# Patient Record
Sex: Female | Born: 1965 | Race: Black or African American | Hispanic: No | Marital: Married | Smoking: Never smoker
Health system: Southern US, Community
[De-identification: ages and names within clinical notes are randomized; demographics above are authoritative.]

## PROBLEM LIST (undated history)

## (undated) DIAGNOSIS — E0590 Thyrotoxicosis, unspecified without thyrotoxic crisis or storm: Secondary | ICD-10-CM

## (undated) DIAGNOSIS — E049 Nontoxic goiter, unspecified: Secondary | ICD-10-CM

## (undated) DIAGNOSIS — Z8619 Personal history of other infectious and parasitic diseases: Secondary | ICD-10-CM

## (undated) DIAGNOSIS — I10 Essential (primary) hypertension: Secondary | ICD-10-CM

## (undated) DIAGNOSIS — T7840XA Allergy, unspecified, initial encounter: Secondary | ICD-10-CM

## (undated) DIAGNOSIS — I519 Heart disease, unspecified: Secondary | ICD-10-CM

## (undated) DIAGNOSIS — I5020 Unspecified systolic (congestive) heart failure: Secondary | ICD-10-CM

## (undated) HISTORY — DX: Essential (primary) hypertension: I10

## (undated) HISTORY — DX: Nontoxic goiter, unspecified: E04.9

## (undated) HISTORY — DX: Heart disease, unspecified: I51.9

## (undated) HISTORY — DX: Personal history of other infectious and parasitic diseases: Z86.19

## (undated) HISTORY — DX: Unspecified systolic (congestive) heart failure: I50.20

## (undated) HISTORY — DX: Morbid (severe) obesity due to excess calories: E66.01

## (undated) HISTORY — DX: Thyrotoxicosis, unspecified without thyrotoxic crisis or storm: E05.90

## (undated) HISTORY — DX: Allergy, unspecified, initial encounter: T78.40XA

---

## 1992-03-04 HISTORY — PX: ABDOMINAL HYSTERECTOMY: SHX81

## 2012-07-25 ENCOUNTER — Ambulatory Visit: Payer: Self-pay | Admitting: Family Medicine

## 2012-07-27 LAB — BETA STREP CULTURE(ARMC)

## 2013-05-19 ENCOUNTER — Ambulatory Visit: Payer: Self-pay

## 2013-07-09 ENCOUNTER — Encounter (INDEPENDENT_AMBULATORY_CARE_PROVIDER_SITE_OTHER): Payer: Self-pay

## 2013-07-09 ENCOUNTER — Ambulatory Visit (INDEPENDENT_AMBULATORY_CARE_PROVIDER_SITE_OTHER): Payer: BC Managed Care – PPO | Admitting: Adult Health

## 2013-07-09 ENCOUNTER — Telehealth: Payer: Self-pay | Admitting: Adult Health

## 2013-07-09 ENCOUNTER — Encounter: Payer: Self-pay | Admitting: Adult Health

## 2013-07-09 VITALS — BP 147/89 | HR 100 | Temp 98.0°F | Resp 16 | Ht 71.0 in | Wt 281.5 lb

## 2013-07-09 DIAGNOSIS — I10 Essential (primary) hypertension: Secondary | ICD-10-CM

## 2013-07-09 DIAGNOSIS — Z1239 Encounter for other screening for malignant neoplasm of breast: Secondary | ICD-10-CM

## 2013-07-09 DIAGNOSIS — E0590 Thyrotoxicosis, unspecified without thyrotoxic crisis or storm: Secondary | ICD-10-CM

## 2013-07-09 MED ORDER — METOPROLOL TARTRATE 50 MG PO TABS: 50.0000 mg | ORAL_TABLET | Freq: Every day | ORAL | Status: DC

## 2013-07-09 NOTE — Patient Instructions (Signed)
  Start your metoprolol. I have sent in a prescription.  Also, please start your thyroid medication. I am referring your to Dr. Elvera LennoxGherghe. We will contact you with an appointment.  I am sending you for a mammogram. Please see Robin.  I will request your records from Dr. Tedd SiasSolum and the labs from [facility].  You will need to schedule your physical exam. You can schedule this when your work schedule calms down some.  Remember to start walking. Introduce fruit into your diet. Drink plenty of water.

## 2013-07-09 NOTE — Progress Notes (Signed)
Pre visit review using our clinic review tool, if applicable. No additional management support is needed unless otherwise documented below in the visit note. 

## 2013-07-09 NOTE — Progress Notes (Signed)
Subjective:    Patient ID: Jeanne [name], female    DOB: 1965-05-28, 48 y.o.   MRN: [id number]  HPI Pt is a pleasant 48 y/o female who presents to clinic to establish care. She works at [employer] and has been going to [facility]. Recently diagnosed with hyperthyroidism. She has [facility] to [facility]. She was prescribed methimazole 10 mg however she has not started taking this.  Hx of HTN on metoprolol. She has run out of her medication and will need a refill. Currently not exercising. She is trying to improve her diet.   Needs a mammogram. She had a hysterectomy in her early 3920s. No abnormal PAP. She reports no longer needing PAPs. She has [facility] and also at [facility]. Will request those records.   Past Medical History  Diagnosis Date  . History of chicken pox   . Allergy   . Hypertension   . Hyperthyroidism      Past Surgical History  Procedure Laterality Date  . Abdominal hysterectomy  1994    [place]     Family History  Problem Relation Age of Onset  . Hypertension Maternal Grandmother   . Diabetes Maternal Grandmother      History   Social History  . Marital Status: Single    Spouse Name: N/A    Number of Children: N/A  . Years of Education: N/A   Occupational History  . Not on file.   Social History Main Topics  . Smoking status: Never Smoker   . Smokeless tobacco: Never Used  . Alcohol Use: Yes     Comment: socially  . Drug Use: No  . Sexual Activity: Not on file   Other Topics Concern  . Not on file   Social History Narrative  . No narrative on file    Review of Systems  Constitutional: Negative.   HENT: Negative.   Eyes: Negative.   Respiratory: Negative.   Cardiovascular: Negative.   Gastrointestinal: Negative.   Endocrine: Negative.   Genitourinary: Negative.   Musculoskeletal: Negative.   Skin: Negative.   Allergic/Immunologic: Negative.   Neurological: Negative.     Hematological: Negative.   Psychiatric/Behavioral: Negative.    BP 147/89  Pulse 100  Temp(Src) 98 F (36.7 C) (Oral)  Resp 16  Ht 5\' 11"  (1.803 m)  Wt 281 lb 8 oz (127.688 kg)  BMI 39.28 kg/m2  SpO2 100%     Objective:   Physical Exam  Constitutional: She is oriented to person, place, and time. She appears well-developed and well-nourished. No distress.  HENT:  Head: Normocephalic and atraumatic.  Right Ear: External ear normal.  Left Ear: External ear normal.  Nose: Nose normal.  Mouth/Throat: Oropharynx is clear and moist.  Eyes: Conjunctivae and EOM are normal. Pupils are equal, round, and reactive to light.  Neck: Normal range of motion. Neck supple. No tracheal deviation present. No thyromegaly present.  Cardiovascular: Normal rate, regular rhythm, normal heart sounds and intact distal pulses.  Exam reveals no gallop and no friction rub.   No murmur heard. Pulmonary/Chest: Effort normal and breath sounds normal. No respiratory distress. She has no wheezes. She has no rales.  Abdominal: Soft. Bowel sounds are normal. She exhibits no distension and no mass. There is no tenderness. There is no rebound and no guarding.  Musculoskeletal: Normal range of motion. She exhibits no edema and no tenderness.  Lymphadenopathy:    She has no  cervical adenopathy.  Neurological: She is alert and oriented to person, place, and time. She has normal reflexes. No cranial nerve deficit. Coordination normal.  Skin: Skin is warm and dry.  Psychiatric: She has a normal mood and affect. Her behavior is normal. Judgment and thought content normal.       Assessment & Plan:   1. HTN (hypertension) Refill metoprolol 50 mg. Request recent blood work  2. Hyperthyroidism She will start her tapazole. I am referring her to Dr. Elvera LennoxGherghe. She saw Dr. Tedd SiasSolum but felt that she did not provide her with options for her overactive thyroid. She kept insisting that she had to do the radiation and there was no  other treatment. Pt did not like her bedside manner. Felt she was treated more like a child than an adult. She prefers to [facility] within [facility].  - Ambulatory referral to Endocrinology  3. Screening for breast cancer Sending pt for screening mammogram.  - MM DIGITAL SCREENING BILATERAL; Future

## 2013-07-09 NOTE — Telephone Encounter (Signed)
Mammogram scheduled 07/27/13 @ 10:30  imaging Pt aware of appointment

## 2013-07-10 ENCOUNTER — Telehealth: Payer: Self-pay | Admitting: Adult Health

## 2013-07-10 NOTE — Telephone Encounter (Signed)
Relevant patient education assigned to patient using Emmi. ° °

## 2013-07-16 NOTE — Telephone Encounter (Signed)
Put appointmnet date and time on order

## 2013-08-31 ENCOUNTER — Ambulatory Visit: Payer: BC Managed Care – PPO | Admitting: Internal Medicine

## 2014-05-11 ENCOUNTER — Encounter: Payer: Self-pay | Admitting: Nurse Practitioner

## 2014-05-11 ENCOUNTER — Ambulatory Visit (INDEPENDENT_AMBULATORY_CARE_PROVIDER_SITE_OTHER): Payer: BLUE CROSS/BLUE SHIELD | Admitting: Nurse Practitioner

## 2014-05-11 VITALS — BP 122/84 | HR 87 | Temp 98.1°F | Resp 14 | Ht 71.0 in | Wt 277.4 lb

## 2014-05-11 DIAGNOSIS — J069 Acute upper respiratory infection, unspecified: Secondary | ICD-10-CM

## 2014-05-11 DIAGNOSIS — E0590 Thyrotoxicosis, unspecified without thyrotoxic crisis or storm: Secondary | ICD-10-CM

## 2014-05-11 DIAGNOSIS — M545 Low back pain, unspecified: Secondary | ICD-10-CM

## 2014-05-11 DIAGNOSIS — B9789 Other viral agents as the cause of diseases classified elsewhere: Secondary | ICD-10-CM

## 2014-05-11 NOTE — Progress Notes (Signed)
Subjective:    Patient ID: Jeanne [name], female    DOB: 1965/10/18, 49 y.o.   MRN: [id number]  HPI  Ms. Barnabas ListerWashington is a 49 yo female with a CC of back pain and cold symptoms.   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 Right arm numbness. 10/10 pain at its worst  Ice, Ibuprofen, heat, ASA- somewhat helpful   Review of Systems  Constitutional: Negative for fever, chills, diaphoresis and fatigue.  Respiratory: Negative for chest tightness, shortness of breath and wheezing.   Cardiovascular: Negative for chest pain, palpitations and leg swelling.  Gastrointestinal: Negative for nausea, vomiting, diarrhea and rectal pain.  Musculoskeletal: Positive for myalgias and back pain. Negative for joint swelling, arthralgias, gait problem, neck pain and neck stiffness.       Left side low back pain  Skin: Negative for rash.  Neurological: Positive for light-headedness. Negative for dizziness, weakness, numbness and headaches.  Psychiatric/Behavioral: The patient is not nervous/anxious.    Past Medical History  Diagnosis Date  . History of chicken pox   . Allergy   . Hypertension   . Hyperthyroidism     History   Social History  . Marital Status: Single    Spouse Name: N/A  . Number of Children: N/A  . Years of Education: N/A   Occupational History  . Not on file.   Social History Main Topics  . Smoking status: Never Smoker   . Smokeless tobacco: Never Used  . Alcohol Use: Yes     Comment: socially  . Drug Use: No  . Sexual Activity: Not on file   Other Topics Concern  . Not on file   Social History  Narrative    Past Surgical History  Procedure Laterality Date  . Abdominal hysterectomy  1994    [place]    Family History  Problem Relation Age of Onset  . Hypertension Maternal Grandmother   . Diabetes Maternal Grandmother     No Known Allergies  Current Outpatient Prescriptions on File Prior to Visit  Medication Sig Dispense Refill  . Azelastine-Fluticasone (DYMISTA) 137-50 MCG/ACT SUSP Place 1 spray into the nose daily.    . chlorpheniramine (CHLOR-TRIMETON) 4 MG tablet Take 4 mg by mouth 2 (two) times daily as needed for allergies.    . methimazole (TAPAZOLE) 10 MG tablet Take 10 mg by mouth daily.    . metoprolol (LOPRESSOR) 50 MG tablet Take 1 tablet (50 mg total) by mouth daily. (Patient not taking: Reported on 05/11/2014) 30 tablet 11  . VITAMIN D, CHOLECALCIFEROL, PO Take 4,000 Units by mouth daily.     No current facility-administered medications on file prior to visit.      Objective:   Physical Exam  Constitutional: She is oriented to person, place, and time. She appears well-developed and well-nourished. No distress.  BP 122/84 mmHg  Pulse 87  Temp(Src) 98.1 F (36.7 C) (Oral)  Resp 14  Ht 5\' 11"  (1.803 m)  Wt 277 lb 6.4 oz (125.828 kg)  BMI 38.71 kg/m2  SpO2 98%   HENT:  Head: Normocephalic and atraumatic.  Right Ear: External ear normal.  Left Ear:  External ear normal.  Mouth/Throat: No oropharyngeal exudate.  TM's clear  Eyes: Right eye exhibits no discharge. Left eye exhibits no discharge. No scleral icterus.  Neck: Normal range of motion. Neck supple. No thyromegaly present.  Cardiovascular: Normal rate, regular rhythm, normal heart sounds and intact distal pulses.  Exam reveals no gallop and no friction rub.   No murmur heard. Pulmonary/Chest: Effort normal and breath sounds normal. No respiratory distress. She has no wheezes. She has no rales. She exhibits no tenderness.  Lymphadenopathy:    She has no cervical adenopathy.  Neurological: She is  alert and oriented to person, place, and time. No cranial nerve deficit. She exhibits normal muscle tone. Coordination normal.  Iliopsoas 5/5 Bilateral, Tib anterior 5/5 bilateral, EHL 5/5 bilateral, no ankle clonus, intact heel/toe/sequential walking, sensation intact upper and lower extremities. Straight leg raise negative bilaterally.   Skin: Skin is warm and dry. No rash noted. She is not diaphoretic.  Psychiatric: She has a normal mood and affect. Her behavior is normal. Judgment and thought content normal.      Assessment & Plan:

## 2014-05-11 NOTE — Progress Notes (Signed)
Pre visit review using our clinic review tool, if applicable. No additional management support is needed unless otherwise documented below in the visit note. 

## 2014-05-11 NOTE — Patient Instructions (Addendum)
Ice, ibuprofen, and lower back x-ray.   We will follow up in 2 weeks .  Visit the lab before leaving today.   Cold- Dayquil, nyquil and ibuprofen as needed. Should be better in a week or so.   [place] [facility] Address:  Address: [address], [place], [place] [address]  [phone] X-rays until 4:15 pm.

## 2014-05-12 ENCOUNTER — Other Ambulatory Visit: Payer: Self-pay | Admitting: Nurse Practitioner

## 2014-05-12 ENCOUNTER — Ambulatory Visit (INDEPENDENT_AMBULATORY_CARE_PROVIDER_SITE_OTHER)
Admission: RE | Admit: 2014-05-12 | Discharge: 2014-05-12 | Disposition: A | Discharge status: Home / Self Care | Payer: BLUE CROSS/BLUE SHIELD | Source: Ambulatory Visit | Attending: Nurse Practitioner | Admitting: Nurse Practitioner

## 2014-05-12 DIAGNOSIS — B9789 Other viral agents as the cause of diseases classified elsewhere: Secondary | ICD-10-CM

## 2014-05-12 DIAGNOSIS — M545 Low back pain, unspecified: Secondary | ICD-10-CM

## 2014-05-12 DIAGNOSIS — E0590 Thyrotoxicosis, unspecified without thyrotoxic crisis or storm: Secondary | ICD-10-CM

## 2014-05-12 DIAGNOSIS — J069 Acute upper respiratory infection, unspecified: Secondary | ICD-10-CM | POA: Insufficient documentation

## 2014-05-12 LAB — T3: T3, Total: 316.9 ng/dL — ABNORMAL HIGH (ref 80.0–204.0)

## 2014-05-12 LAB — T4, FREE: Free T4: 2.01 ng/dL — ABNORMAL HIGH (ref 0.60–1.60)

## 2014-05-12 LAB — TSH: TSH: 0.12 u[IU]/mL — AB (ref 0.35–4.50)

## 2014-05-12 NOTE — Assessment & Plan Note (Signed)
Probable viral illness with cough. Continue OTC medications and good hydration.

## 2014-05-12 NOTE — Progress Notes (Signed)
Done. Thanks.

## 2014-05-12 NOTE — Assessment & Plan Note (Signed)
Obtain TSH, T4 free, and T3. Will follow.

## 2014-05-12 NOTE — Assessment & Plan Note (Addendum)
l[facility]bar x[facility] from [facility]. Continue ice, ibuprofen until results come back. FU in 2 weeks.

## 2014-05-13 ENCOUNTER — Telehealth: Payer: Self-pay | Admitting: Nurse Practitioner

## 2014-05-13 ENCOUNTER — Other Ambulatory Visit: Payer: Self-pay | Admitting: Nurse Practitioner

## 2014-05-13 MED ORDER — PREDNISONE 10 MG PO TABS: ORAL_TABLET | ORAL | Status: DC

## 2014-05-13 MED ORDER — CYCLOBENZAPRINE HCL 5 MG PO TABS: 5.0000 mg | ORAL_TABLET | Freq: Every day | ORAL | Status: DC

## 2014-05-13 NOTE — Telephone Encounter (Signed)
FMLA paper work was dropped off. Paper work in [employer] box/msn

## 2014-05-13 NOTE — Telephone Encounter (Signed)
Received. Thank you.

## 2014-05-17 ENCOUNTER — Telehealth: Payer: Self-pay | Admitting: Nurse Practitioner

## 2014-05-17 NOTE — Telephone Encounter (Signed)
Jeanne [name] from [employer] HR, called requesting status of FMLA paperwork.  She states she mainly needs a return to work date, if unable to fill out the paperwork today.  Please advise

## 2014-05-17 NOTE — Telephone Encounter (Signed)
Spoke with Steward DroneBrenda advised of Carries message, she requested a note be faxed stating return date.  Rx faxed as requested

## 2014-05-17 NOTE — Telephone Encounter (Signed)
Unable to finish by 5 pm today, will continue working on it tonight. She can start back today. Thanks!

## 2014-06-22 ENCOUNTER — Ambulatory Visit: Payer: BLUE CROSS/BLUE SHIELD | Admitting: Endocrinology

## 2018-03-18 ENCOUNTER — Other Ambulatory Visit: Payer: Self-pay

## 2018-03-18 ENCOUNTER — Ambulatory Visit
Admission: EM | Admit: 2018-03-18 | Discharge: 2018-03-18 | Disposition: A | Discharge status: Home / Self Care | Payer: BLUE CROSS/BLUE SHIELD | Attending: Family Medicine | Admitting: Family Medicine

## 2018-03-18 ENCOUNTER — Ambulatory Visit (INDEPENDENT_AMBULATORY_CARE_PROVIDER_SITE_OTHER): Payer: Self-pay

## 2018-03-18 DIAGNOSIS — R059 Cough, unspecified: Secondary | ICD-10-CM

## 2018-03-18 DIAGNOSIS — R05 Cough: Secondary | ICD-10-CM

## 2018-03-18 DIAGNOSIS — J9801 Acute bronchospasm: Secondary | ICD-10-CM

## 2018-03-18 DIAGNOSIS — I10 Essential (primary) hypertension: Secondary | ICD-10-CM

## 2018-03-18 MED ORDER — PREDNISONE 20 MG PO TABS: ORAL_TABLET | ORAL | 0 refills | Status: DC

## 2018-03-18 NOTE — ED Provider Notes (Signed)
[facility]    CSN: [id number] Arrival date & time: 03/18/18  1117     History   Chief Complaint Chief Complaint  Patient presents with  . Cough    HPI Jeanne [name] is a 53 y.o. female.   53 yo female with a c/o intermittent chronic cough since November. Denies any fevers, chills. Has had some wheezing. Recently treated with antibiotic and inhaler, however states no improvement.   The history is provided by the patient.  Cough    Past Medical History:  Diagnosis Date  . Allergy   . History of chicken pox   . Hypertension   . Hyperthyroidism     Patient Active Problem List   Diagnosis Date Noted  . Left-sided low back pain without sciatica 05/12/2014  . Viral URI with cough 05/12/2014  . HTN (hypertension) 07/09/2013  . Hyperthyroidism 07/09/2013    Past Surgical History:  Procedure Laterality Date  . ABDOMINAL HYSTERECTOMY  1994   [place]    OB History   No obstetric history on file.      Home Medications    Prior to Admission medications   Medication Sig Start Date End Date Taking? Authorizing Provider  albuterol (PROVENTIL HFA;VENTOLIN HFA) 108 (90 Base) MCG/ACT inhaler INHALE TWO PUFFS BY MOUTH EVERY SIX HOURS AS NEEDED 03/06/18   [provider]  Azelastine-Fluticasone (DYMISTA) 137-50 MCG/ACT SUSP Place 1 spray into the nose daily.    [provider]  chlorpheniramine (CHLOR-TRIMETON) 4 MG tablet Take 4 mg by mouth 2 (two) times daily as needed for allergies.    [provider]  cyclobenzaprine (FLEXERIL) 5 MG tablet Take 1 tablet (5 mg total) by mouth at bedtime. 05/13/14   Carollee Leitz, RN  methimazole (TAPAZOLE) 10 MG tablet Take 10 mg by mouth daily.    [provider]  metoprolol (LOPRESSOR) 50 MG tablet Take 1 tablet (50 mg total) by mouth daily. Patient not taking: Reported on 05/11/2014 07/09/13   Novella Olive, NP  predniSONE (DELTASONE) 20 MG tablet 3 tabs po once day 1, then 2 tabs po qd x 2  days, then 1 tab po qd x 2 days, then half a tab po qd x 2 days 03/18/18   Payton Mccallum, MD  VITAMIN D, CHOLECALCIFEROL, PO Take 4,000 Units by mouth daily.    [provider]    Family History Family History  Problem Relation Age of Onset  . Hypertension Maternal Grandmother   . Diabetes Maternal Grandmother   . Dementia Mother     Social History Social History   Tobacco Use  . Smoking status: Never Smoker  . Smokeless tobacco: Never Used  Substance Use Topics  . Alcohol use: Yes    Comment: socially  . Drug use: No     Allergies   Patient has no known allergies.   Review of Systems Review of Systems  Respiratory: Positive for cough.      Physical Exam Triage Vital Signs ED Triage Vitals  Enc Vitals Group     BP 03/18/18 1131 (!) 132/110     Pulse Rate 03/18/18 1131 61     Resp 03/18/18 1131 (!) 24     Temp 03/18/18 1131 98 F (36.7 C)     Temp Source 03/18/18 1131 Oral     SpO2 03/18/18 1131 100 %     Weight 03/18/18 1132 278 lb (126.1 kg)     Height 03/18/18 1132 5\' 11"  (1.803 m)  Head Circumference --      Peak Flow --      Pain Score 03/18/18 1132 4     Pain Loc --      Pain Edu? --      Excl. in GC? --    No data found.  Updated Vital Signs BP (!) 132/110 (BP Location: Left Arm)   Pulse 61   Temp 98 F (36.7 C) (Oral)   Resp (!) 24   Ht 5\' 11"  (1.803 m)   Wt 126.1 kg   SpO2 100%   BMI 38.77 kg/m   Visual Acuity Right Eye Distance:   Left Eye Distance:   Bilateral Distance:    Right Eye Near:   Left Eye Near:    Bilateral Near:     Physical Exam Vitals signs and nursing note reviewed.  Constitutional:      General: She is not in acute distress.    Appearance: Normal appearance. She is well-developed. She is not toxic-appearing or diaphoretic.  HENT:     Head: Normocephalic and atraumatic.     Nose: Mucosal edema present. No nasal deformity, septal deviation or laceration.     Right Sinus: Maxillary sinus  tenderness and frontal sinus tenderness present.     Left Sinus: Maxillary sinus tenderness and frontal sinus tenderness present.     Mouth/Throat:     Pharynx: Uvula midline. No oropharyngeal exudate.  Eyes:     General: No scleral icterus.       Right eye: No discharge.        Left eye: No discharge.  Neck:     Musculoskeletal: Normal range of motion and neck supple.     Thyroid: No thyromegaly.  Cardiovascular:     Rate and Rhythm: Normal rate and regular rhythm.     Heart sounds: Normal heart sounds.  Pulmonary:     Effort: Pulmonary effort is normal. No respiratory distress.     Breath sounds: No stridor. Wheezing (few, expiratory) present. No rhonchi or rales.  Lymphadenopathy:     Cervical: No cervical adenopathy.  Neurological:     Mental Status: She is alert.      UC Treatments / Results  Labs (all labs ordered are listed, but only abnormal results are displayed) Labs Reviewed - No data to display  EKG None  Radiology Dg Chest 2 View  Result Date: 03/18/2018 CLINICAL DATA:  Chronic cough and shortness of breath. EXAM: CHEST - 2 VIEW COMPARISON:  None. FINDINGS: Mild cardiomegaly. Normal mediastinal contours. Normal pulmonary vascularity. No focal consolidation, pleural effusion, or pneumothorax. No acute osseous abnormality. IMPRESSION: 1. Mild cardiomegaly.  No active cardiopulmonary disease. Electronically Signed   By: Obie Dredge M.D.   On: 03/18/2018 13:35    Procedures Procedures (including critical care time)  Medications Ordered in UC Medications - No data to display  Initial Impression / Assessment and Plan / UC Course  I have reviewed the triage vital signs and the nursing notes.  Pertinent labs & imaging results that were available during my care of the patient were reviewed by me and considered in my medical decision making (see chart for details).      Final Clinical Impressions(s) / UC Diagnoses   Final diagnoses:  Cough  Bronchospasm     ED Prescriptions    Medication Sig Dispense Auth. Provider   predniSONE (DELTASONE) 20 MG tablet 3 tabs po once day 1, then 2 tabs po qd x 2 days, then 1 tab po  qd x 2 days, then half a tab po qd x 2 days 10 tablet Payton Mccallumonty, Logun Colavito, MD      1. x-ray results and diagnosis reviewed with patient 2. rx as per orders above; reviewed possible side effects, interactions, risks and benefits  3. Recommend continue inhaler 4. Follow-up prn if symptoms worsen or don't improve   Controlled Substance Prescriptions [place] Controlled Substance Registry consulted? Not Applicable   Payton Mccallumonty, Elwin Tsou, MD 03/18/18 [phone]

## 2018-03-18 NOTE — ED Triage Notes (Signed)
Pt with cough since November "coming and going."  Last week had consult with on-line Dr. And diagnosed with bronchitis. Using inhaler and ABX but still has cough. No fever. No distress in triage

## 2019-09-03 ENCOUNTER — Emergency Department (HOSPITAL_COMMUNITY): Payer: Self-pay

## 2019-09-03 ENCOUNTER — Encounter (HOSPITAL_COMMUNITY): Payer: Self-pay | Admitting: Emergency Medicine

## 2019-09-03 ENCOUNTER — Other Ambulatory Visit: Payer: Self-pay

## 2019-09-03 ENCOUNTER — Emergency Department (HOSPITAL_COMMUNITY): Payer: Self-pay | Admitting: Certified Registered"

## 2019-09-03 ENCOUNTER — Encounter (HOSPITAL_COMMUNITY)
Admission: EM | Disposition: A | Discharge status: Home / Self Care | Payer: Self-pay | Source: Home / Self Care | Attending: Neurology

## 2019-09-03 ENCOUNTER — Inpatient Hospital Stay (HOSPITAL_COMMUNITY)
Admission: EM | Admit: 2019-09-03 | Discharge: 2019-09-08 | DRG: 061 | Disposition: A | Discharge status: Home / Self Care | Payer: Self-pay | Attending: Neurology | Admitting: Neurology

## 2019-09-03 DIAGNOSIS — I5043 Acute on chronic combined systolic (congestive) and diastolic (congestive) heart failure: Secondary | ICD-10-CM | POA: Diagnosis present

## 2019-09-03 DIAGNOSIS — I63411 Cerebral infarction due to embolism of right middle cerebral artery: Principal | ICD-10-CM | POA: Diagnosis present

## 2019-09-03 DIAGNOSIS — R29708 NIHSS score 8: Secondary | ICD-10-CM | POA: Diagnosis present

## 2019-09-03 DIAGNOSIS — G8194 Hemiplegia, unspecified affecting left nondominant side: Secondary | ICD-10-CM | POA: Diagnosis present

## 2019-09-03 DIAGNOSIS — R471 Dysarthria and anarthria: Secondary | ICD-10-CM | POA: Diagnosis present

## 2019-09-03 DIAGNOSIS — I6601 Occlusion and stenosis of right middle cerebral artery: Secondary | ICD-10-CM | POA: Diagnosis present

## 2019-09-03 DIAGNOSIS — I639 Cerebral infarction, unspecified: Secondary | ICD-10-CM | POA: Diagnosis present

## 2019-09-03 DIAGNOSIS — Z6838 Body mass index (BMI) 38.0-38.9, adult: Secondary | ICD-10-CM

## 2019-09-03 DIAGNOSIS — I10 Essential (primary) hypertension: Secondary | ICD-10-CM | POA: Diagnosis present

## 2019-09-03 DIAGNOSIS — R4701 Aphasia: Secondary | ICD-10-CM | POA: Diagnosis present

## 2019-09-03 DIAGNOSIS — R233 Spontaneous ecchymoses: Secondary | ICD-10-CM | POA: Diagnosis present

## 2019-09-03 DIAGNOSIS — R29703 NIHSS score 3: Secondary | ICD-10-CM | POA: Diagnosis not present

## 2019-09-03 DIAGNOSIS — Z90710 Acquired absence of both cervix and uterus: Secondary | ICD-10-CM

## 2019-09-03 DIAGNOSIS — E669 Obesity, unspecified: Secondary | ICD-10-CM | POA: Diagnosis present

## 2019-09-03 DIAGNOSIS — I429 Cardiomyopathy, unspecified: Secondary | ICD-10-CM

## 2019-09-03 DIAGNOSIS — H510 Palsy (spasm) of conjugate gaze: Secondary | ICD-10-CM | POA: Diagnosis present

## 2019-09-03 DIAGNOSIS — I63319 Cerebral infarction due to thrombosis of unspecified middle cerebral artery: Secondary | ICD-10-CM

## 2019-09-03 DIAGNOSIS — E0590 Thyrotoxicosis, unspecified without thyrotoxic crisis or storm: Secondary | ICD-10-CM | POA: Diagnosis present

## 2019-09-03 DIAGNOSIS — I671 Cerebral aneurysm, nonruptured: Secondary | ICD-10-CM | POA: Diagnosis present

## 2019-09-03 DIAGNOSIS — R29705 NIHSS score 5: Secondary | ICD-10-CM | POA: Diagnosis not present

## 2019-09-03 DIAGNOSIS — Z8249 Family history of ischemic heart disease and other diseases of the circulatory system: Secondary | ICD-10-CM

## 2019-09-03 DIAGNOSIS — R270 Ataxia, unspecified: Secondary | ICD-10-CM | POA: Diagnosis present

## 2019-09-03 DIAGNOSIS — G936 Cerebral edema: Secondary | ICD-10-CM | POA: Diagnosis present

## 2019-09-03 DIAGNOSIS — R29706 NIHSS score 6: Secondary | ICD-10-CM | POA: Diagnosis present

## 2019-09-03 DIAGNOSIS — R4189 Other symptoms and signs involving cognitive functions and awareness: Secondary | ICD-10-CM | POA: Diagnosis present

## 2019-09-03 DIAGNOSIS — R414 Neurologic neglect syndrome: Secondary | ICD-10-CM | POA: Diagnosis present

## 2019-09-03 DIAGNOSIS — R29704 NIHSS score 4: Secondary | ICD-10-CM | POA: Diagnosis not present

## 2019-09-03 DIAGNOSIS — Z20822 Contact with and (suspected) exposure to covid-19: Secondary | ICD-10-CM | POA: Diagnosis present

## 2019-09-03 DIAGNOSIS — Z79899 Other long term (current) drug therapy: Secondary | ICD-10-CM

## 2019-09-03 DIAGNOSIS — R29810 Facial weakness: Secondary | ICD-10-CM | POA: Diagnosis present

## 2019-09-03 DIAGNOSIS — R531 Weakness: Secondary | ICD-10-CM

## 2019-09-03 DIAGNOSIS — I110 Hypertensive heart disease with heart failure: Secondary | ICD-10-CM | POA: Diagnosis present

## 2019-09-03 HISTORY — PX: RADIOLOGY WITH ANESTHESIA: SHX6223

## 2019-09-03 HISTORY — PX: IR ANGIO INTRA EXTRACRAN SEL COM CAROTID INNOMINATE BILAT MOD SED: IMG5360

## 2019-09-03 HISTORY — PX: IR ANGIO VERTEBRAL SEL SUBCLAVIAN INNOMINATE UNI R MOD SED: IMG5365

## 2019-09-03 LAB — CBC
HCT: 39.6 % (ref 36.0–46.0)
Hemoglobin: 12.1 g/dL (ref 12.0–15.0)
MCH: 20.2 pg — ABNORMAL LOW (ref 26.0–34.0)
MCHC: 30.6 g/dL (ref 30.0–36.0)
MCV: 66 fL — ABNORMAL LOW (ref 80.0–100.0)
Platelets: 202 10*3/uL (ref 150–400)
RBC: 6 MIL/uL — ABNORMAL HIGH (ref 3.87–5.11)
RDW: 22.7 % — ABNORMAL HIGH (ref 11.5–15.5)
WBC: 8.2 10*3/uL (ref 4.0–10.5)
nRBC: 0 % (ref 0.0–0.2)

## 2019-09-03 LAB — SARS CORONAVIRUS 2 BY RT PCR (HOSPITAL ORDER, PERFORMED IN ~~LOC~~ HOSPITAL LAB): SARS Coronavirus 2: NEGATIVE

## 2019-09-03 LAB — I-STAT CHEM 8, ED
BUN: 15 mg/dL (ref 6–20)
Calcium, Ion: 1.19 mmol/L (ref 1.15–1.40)
Chloride: 104 mmol/L (ref 98–111)
Creatinine, Ser: 0.5 mg/dL (ref 0.44–1.00)
Glucose, Bld: 95 mg/dL (ref 70–99)
HCT: 40 % (ref 36.0–46.0)
Hemoglobin: 13.6 g/dL (ref 12.0–15.0)
Potassium: 4.5 mmol/L (ref 3.5–5.1)
Sodium: 140 mmol/L (ref 135–145)
TCO2: 30 mmol/L (ref 22–32)

## 2019-09-03 LAB — DIFFERENTIAL
Abs Immature Granulocytes: 0.01 10*3/uL (ref 0.00–0.07)
Basophils Absolute: 0 10*3/uL (ref 0.0–0.1)
Basophils Relative: 0 %
Eosinophils Absolute: 0.1 10*3/uL (ref 0.0–0.5)
Eosinophils Relative: 1 %
Immature Granulocytes: 0 %
Lymphocytes Relative: 20 %
Lymphs Abs: 1.6 10*3/uL (ref 0.7–4.0)
Monocytes Absolute: 0.9 10*3/uL (ref 0.1–1.0)
Monocytes Relative: 10 %
Neutro Abs: 5.6 10*3/uL (ref 1.7–7.7)
Neutrophils Relative %: 69 %

## 2019-09-03 LAB — COMPREHENSIVE METABOLIC PANEL
ALT: 15 U/L (ref 0–44)
AST: 17 U/L (ref 15–41)
Albumin: 3.5 g/dL (ref 3.5–5.0)
Alkaline Phosphatase: 106 U/L (ref 38–126)
Anion gap: 8 (ref 5–15)
BUN: 14 mg/dL (ref 6–20)
CO2: 25 mmol/L (ref 22–32)
Calcium: 8.6 mg/dL — ABNORMAL LOW (ref 8.9–10.3)
Chloride: 106 mmol/L (ref 98–111)
Creatinine, Ser: 0.48 mg/dL (ref 0.44–1.00)
GFR calc Af Amer: >60 mL/min (ref >60)
GFR calc non Af Amer: >60 mL/min (ref >60)
Glucose, Bld: 98 mg/dL (ref 70–99)
Potassium: 3.7 mmol/L (ref 3.5–5.1)
Sodium: 139 mmol/L (ref 135–145)
Total Bilirubin: 0.6 mg/dL (ref 0.3–1.2)
Total Protein: 6.9 g/dL (ref 6.5–8.1)

## 2019-09-03 LAB — APTT: aPTT: 32 seconds (ref 24–36)

## 2019-09-03 LAB — ETHANOL: Alcohol, Ethyl (B): <10 mg/dL (ref <10)

## 2019-09-03 LAB — CBG MONITORING, ED
Glucose-Capillary: 93 mg/dL (ref 70–99)
Glucose-Capillary: 91 mg/dL (ref 70–99)

## 2019-09-03 LAB — PROTIME-INR
INR: 1.2 (ref 0.8–1.2)
Prothrombin Time: 14.7 seconds (ref 11.4–15.2)

## 2019-09-03 SURGERY — IR WITH ANESTHESIA
Anesthesia: General

## 2019-09-03 MED ORDER — ACETAMINOPHEN 160 MG/5ML PO SOLN: 1000.0000 mg | Freq: Once | ORAL | Status: DC | PRN

## 2019-09-03 MED ORDER — ALTEPLASE 100 MG IV SOLR
INTRAVENOUS | Status: AC
  Administered 2019-09-03: 90 mg via INTRAVENOUS
  Filled 2019-09-03: qty 100

## 2019-09-03 MED ORDER — ACETAMINOPHEN 650 MG RE SUPP
650.0000 mg | RECTAL | Status: DC | PRN
  Filled 2019-09-03: qty 1

## 2019-09-03 MED ORDER — LACTATED RINGERS IV SOLN: INTRAVENOUS | Status: DC | PRN

## 2019-09-03 MED ORDER — SUCCINYLCHOLINE CHLORIDE 200 MG/10ML IV SOSY
PREFILLED_SYRINGE | INTRAVENOUS | Status: DC | PRN
  Administered 2019-09-03: 160 mg via INTRAVENOUS

## 2019-09-03 MED ORDER — ONDANSETRON HCL 4 MG/2ML IJ SOLN
INTRAMUSCULAR | Status: DC | PRN
  Administered 2019-09-03: 4 mg via INTRAVENOUS

## 2019-09-03 MED ORDER — ALTEPLASE (STROKE) FULL DOSE INFUSION
90.0000 mg | Freq: Once | INTRAVENOUS | Status: AC
  Filled 2019-09-03: qty 100

## 2019-09-03 MED ORDER — NITROGLYCERIN 1 MG/10 ML FOR IR/CATH LAB
INTRA_ARTERIAL | Status: AC
  Filled 2019-09-03: qty 10

## 2019-09-03 MED ORDER — CEFAZOLIN SODIUM-DEXTROSE 2-4 GM/100ML-% IV SOLN
INTRAVENOUS | Status: AC
  Filled 2019-09-03: qty 100

## 2019-09-03 MED ORDER — ACETAMINOPHEN 10 MG/ML IV SOLN: 1000.0000 mg | Freq: Once | INTRAVENOUS | Status: DC | PRN

## 2019-09-03 MED ORDER — CLEVIDIPINE BUTYRATE 0.5 MG/ML IV EMUL
0.0000 mg/h | INTRAVENOUS | Status: AC
  Administered 2019-09-04: 2 mg/h via INTRAVENOUS
  Filled 2019-09-03: qty 50

## 2019-09-03 MED ORDER — SENNOSIDES-DOCUSATE SODIUM 8.6-50 MG PO TABS: 1.0000 | ORAL_TABLET | Freq: Every evening | ORAL | Status: DC | PRN

## 2019-09-03 MED ORDER — DEXAMETHASONE SODIUM PHOSPHATE 10 MG/ML IJ SOLN
INTRAMUSCULAR | Status: DC | PRN
  Administered 2019-09-03: 4 mg via INTRAVENOUS

## 2019-09-03 MED ORDER — CLEVIDIPINE BUTYRATE 0.5 MG/ML IV EMUL
INTRAVENOUS | Status: AC
  Administered 2019-09-03: 4 mg/h via INTRAVENOUS
  Filled 2019-09-03: qty 50

## 2019-09-03 MED ORDER — IOHEXOL 240 MG/ML SOLN
INTRAMUSCULAR | Status: AC
  Filled 2019-09-03: qty 200

## 2019-09-03 MED ORDER — IOHEXOL 350 MG/ML SOLN
100.0000 mL | Freq: Once | INTRAVENOUS | Status: AC | PRN
  Administered 2019-09-03: 100 mL via INTRAVENOUS

## 2019-09-03 MED ORDER — EPTIFIBATIDE 20 MG/10ML IV SOLN
INTRAVENOUS | Status: AC
  Filled 2019-09-03: qty 10

## 2019-09-03 MED ORDER — FENTANYL CITRATE (PF) 100 MCG/2ML IJ SOLN
INTRAMUSCULAR | Status: DC | PRN
  Administered 2019-09-03: 100 ug via INTRAVENOUS

## 2019-09-03 MED ORDER — ALBUTEROL SULFATE (2.5 MG/3ML) 0.083% IN NEBU: 2.5000 mg | INHALATION_SOLUTION | Freq: Four times a day (QID) | RESPIRATORY_TRACT | Status: DC | PRN

## 2019-09-03 MED ORDER — STROKE: EARLY STAGES OF RECOVERY BOOK
Freq: Once | Status: DC
  Filled 2019-09-03 (×2): qty 1

## 2019-09-03 MED ORDER — METHIMAZOLE 10 MG PO TABS: 10.0000 mg | ORAL_TABLET | Freq: Every day | ORAL | Status: DC

## 2019-09-03 MED ORDER — OXYCODONE HCL 5 MG PO TABS: 5.0000 mg | ORAL_TABLET | Freq: Once | ORAL | Status: DC | PRN

## 2019-09-03 MED ORDER — SODIUM CHLORIDE 0.9 % IV SOLN: INTRAVENOUS | Status: DC

## 2019-09-03 MED ORDER — FENTANYL CITRATE (PF) 100 MCG/2ML IJ SOLN
INTRAMUSCULAR | Status: AC
  Filled 2019-09-03: qty 2

## 2019-09-03 MED ORDER — IOHEXOL 300 MG/ML  SOLN: 50.0000 mL | Freq: Once | INTRAMUSCULAR | Status: DC | PRN

## 2019-09-03 MED ORDER — TIROFIBAN HCL IN NACL 5-0.9 MG/100ML-% IV SOLN
INTRAVENOUS | Status: AC
  Filled 2019-09-03: qty 100

## 2019-09-03 MED ORDER — ACETAMINOPHEN 500 MG PO TABS: 1000.0000 mg | ORAL_TABLET | Freq: Once | ORAL | Status: DC | PRN

## 2019-09-03 MED ORDER — LIDOCAINE 2% (20 MG/ML) 5 ML SYRINGE
INTRAMUSCULAR | Status: DC | PRN
  Administered 2019-09-03: 60 mg via INTRAVENOUS

## 2019-09-03 MED ORDER — FENTANYL CITRATE (PF) 100 MCG/2ML IJ SOLN
25.0000 ug | INTRAMUSCULAR | Status: DC | PRN
  Administered 2019-09-03: 50 ug via INTRAVENOUS

## 2019-09-03 MED ORDER — CYCLOBENZAPRINE HCL 10 MG PO TABS: 5.0000 mg | ORAL_TABLET | Freq: Every day | ORAL | Status: DC

## 2019-09-03 MED ORDER — CEFAZOLIN SODIUM-DEXTROSE 2-3 GM-%(50ML) IV SOLR
INTRAVENOUS | Status: DC | PRN
Start: 2019-09-03 — End: 2019-09-03
  Administered 2019-09-03: 2 g via INTRAVENOUS

## 2019-09-03 MED ORDER — LABETALOL HCL 5 MG/ML IV SOLN
5.0000 mg | Freq: Once | INTRAVENOUS | Status: AC
  Administered 2019-09-03: 5 mg via INTRAVENOUS
  Filled 2019-09-03: qty 4

## 2019-09-03 MED ORDER — ROCURONIUM BROMIDE 10 MG/ML (PF) SYRINGE
PREFILLED_SYRINGE | INTRAVENOUS | Status: DC | PRN
  Administered 2019-09-03: 40 mg via INTRAVENOUS
  Administered 2019-09-03: 10 mg via INTRAVENOUS

## 2019-09-03 MED ORDER — CLOPIDOGREL BISULFATE 300 MG PO TABS
ORAL_TABLET | ORAL | Status: AC
  Filled 2019-09-03: qty 1

## 2019-09-03 MED ORDER — SODIUM CHLORIDE 0.9 % IV SOLN
50.0000 mL | Freq: Once | INTRAVENOUS | Status: AC
  Administered 2019-09-03: 50 mL via INTRAVENOUS

## 2019-09-03 MED ORDER — ACETAMINOPHEN 160 MG/5ML PO SOLN
650.0000 mg | ORAL | Status: DC | PRN
  Filled 2019-09-03: qty 20.3

## 2019-09-03 MED ORDER — SUGAMMADEX SODIUM 200 MG/2ML IV SOLN
INTRAVENOUS | Status: DC | PRN
  Administered 2019-09-03: 200 mg via INTRAVENOUS

## 2019-09-03 MED ORDER — TICAGRELOR 90 MG PO TABS
ORAL_TABLET | ORAL | Status: AC
  Filled 2019-09-03: qty 2

## 2019-09-03 MED ORDER — OXYCODONE HCL 5 MG/5ML PO SOLN: 5.0000 mg | Freq: Once | ORAL | Status: DC | PRN

## 2019-09-03 MED ORDER — AZELASTINE-FLUTICASONE 137-50 MCG/ACT NA SUSP: 1.0000 | Freq: Every day | NASAL | Status: DC

## 2019-09-03 MED ORDER — PROPOFOL 10 MG/ML IV BOLUS
INTRAVENOUS | Status: DC | PRN
  Administered 2019-09-03: 130 mg via INTRAVENOUS

## 2019-09-03 MED ORDER — IOHEXOL 300 MG/ML  SOLN
150.0000 mL | Freq: Once | INTRAMUSCULAR | Status: AC | PRN
  Administered 2019-09-03: 50 mL via INTRA_ARTERIAL

## 2019-09-03 MED ORDER — ACETAMINOPHEN 325 MG PO TABS
650.0000 mg | ORAL_TABLET | ORAL | Status: DC | PRN
  Filled 2019-09-03: qty 2

## 2019-09-03 MED ORDER — ASPIRIN 81 MG PO CHEW
CHEWABLE_TABLET | ORAL | Status: AC
  Filled 2019-09-03: qty 1

## 2019-09-03 MED ORDER — VERAPAMIL HCL 2.5 MG/ML IV SOLN
INTRAVENOUS | Status: AC
  Filled 2019-09-03: qty 2

## 2019-09-03 NOTE — ED Notes (Signed)
Code stroke call at 1325

## 2019-09-03 NOTE — Sedation Documentation (Signed)
Bed control notified of code stroke- Per Robin patient is pre-assigned to 4n28. Awaiting admission orders

## 2019-09-03 NOTE — Sedation Documentation (Signed)
Under care of anesthesia at this time. IR RN present

## 2019-09-03 NOTE — Anesthesia Preprocedure Evaluation (Signed)
Anesthesia Evaluation  Patient identified by MRN, date of birth, ID band Patient awake    Reviewed: Allergy & Precautions, NPO status , Patient's Chart, lab work & pertinent test results  History of Anesthesia Complications Negative for: history of anesthetic complications  Airway Mallampati: III  TM Distance: >3 FB Neck ROM: Full    Dental  (+) Dental Advisory Given   Pulmonary neg recent URI,    breath sounds clear to auscultation       Cardiovascular hypertension,  Rhythm:Irregular     Neuro/Psych CVA negative psych ROS   GI/Hepatic   Endo/Other  Hyperthyroidism   Renal/GU      Musculoskeletal   Abdominal   Peds  Hematology   Anesthesia Other Findings Blood in mouth from tongue bite  Reproductive/Obstetrics                             Anesthesia Physical Anesthesia Plan  ASA: III and emergent  Anesthesia Plan: General   Post-op Pain Management:    Induction: Intravenous, Rapid sequence and Cricoid pressure planned  PONV Risk Score and Plan: 3 and Ondansetron and Dexamethasone  Airway Management Planned: Oral ETT  Additional Equipment: Arterial line  Intra-op Plan:   Post-operative Plan: Possible Post-op intubation/ventilation  Informed Consent:     Only emergency history available and History available from chart only  Plan Discussed with: CRNA and Surgeon  Anesthesia Plan Comments:         Anesthesia Quick Evaluation

## 2019-09-03 NOTE — Consult Note (Addendum)
[facility] Tele[facility]urology Consult Services   Date of Service:   09/03/2019 13:37:46  Impression:     .  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 CT head unremarkable.   Diff Dx; R MCA CVA, other  She is a good candidate for alteplase, the risks, benefits, contraindications reviewed and d.w patient, and sister at bedside. She and sister demonstrate understanding and consent. Benefit given clinical presentation, disabling symptoms outweigh risk of hemorrhage with alteplase. D/w pt, sister, ER. Alteplase bolus administered, no complications, infusion ongoing.  Rec: - CTA brain, neck to r/o LVO, R MCA - CT perfusion  - Post alteplase ICU admit, and orders  - keep SBP < 180/105  - Neurology follow up  - MRI brain  - CVA orders  Above recs d/w ER, and pt, sister.  Metrics: Last Known Well: 09/03/2019 13:00:00 [facility] Notification Time: 09/03/2019 13:37:46 Arrival Time: 09/03/2019 13:24:00 Stamp Time: 09/03/2019 13:37:46 Time First Login Attempt: 09/03/2019 13:44:00 Symptoms: L hemiparesis, dysarthria NIHSS Start Assessment Time: 09/03/2019 13:49:34 Alteplase Early Mix Decision Time: 09/03/2019 13:52:00 Patient is a candidate for Alteplase/Activase. Alteplase Medical Decision: 09/03/2019 13:52:00 Alteplase/Activase CPOE Order Time: 09/03/2019 13:57:01 Needle Time: 09/03/2019 13:59:10 Weight Noted by Staff: 276.1 lbs  CT head showed no acute hemorrhage or acute core infarct.  Radiologist was not called back for review of advanced imaging because reviewed ED Physician notified of diagnostic impression and management plan on 09/03/2019 14:06:09  Advanced Imaging: CTA Head and Neck  Recommended:  CTP Recommended:   Alteplase/Activase Contraindications:  Last Known Well > 4.5 hours: No CT Head showing hemorrhage: No Ischemic stroke within 3 months: No Severe head trauma within 3 months: No Intracranial/intraspinal surgery within 3 months: No History of intracranial hemorrhage: No Symptoms and signs consistent with an SAH: No GI malignancy or GI bleed within 21 days: No Coagulopathy: Platelets <100 000/mm3, INR >1.7, aPTT>40 s, or PT >15 s: No Treatment dose of LMWH within the previous 24 hrs: No Use of NOACs in past 48 hours: No Glycoprotein IIb/IIIa receptor inhibitors use: No Symptoms consistent with infective endocarditis: No Suspected aortic arch dissection: No Intra-axial intracranial neoplasm: No  Verbal Consent to Alteplase/Activase: I have explained to the Patient and Family the nature of the patient's condition, reviewed the indications and contraindications to the use of Alteplase/Activase fibrinolytic agent, reviewed the indications and contraindications and the benefits to be reasonably expected compared with alternative approaches. I have discussed the likelihood of major risks or complications of this procedure including (if applicable) but not limited to loss of limb function, brain damage, paralysis, hemorrhage, infection, complications from transfusion of blood components, drug reactions, blood clots and loss of life. I have also indicated that with any procedure there is always the possibility of an unexpected complication. All questions were answered and Patient and Family express understanding of the treatment plan and consent to the treatment.  Our recommendations are outlined below.  Recommendations: IV Alteplase/Activase recommended.  Alteplase/Activase bolus given Without Complication.   IV Alteplase/Activase Total Dose - 90.0 mg IV Alteplase/Activase Bolus Dose - 9.0 mg IV Alteplase/Activase Infusion Dose - 81.0 mg  Routine post  Alteplase/Activase monitoring including neuro checks and blood pressure control during/after treatment Monitor blood pressure Check blood pressure  and neuro assessment every 15 min for 2 h, then every 30 min for 6 h, and finally every hour for 16 h.  Manage Blood Pressure per post Alteplase/Activase protocol.      .  Admission to ICU     .  CT brain 24 hours post Alteplase/Activase     .  NPO until swallowing screen performed and passed     .  No antiplatelet agents or anticoagulants (including heparin for DVT prophylaxis) in first 24 hours     .  No Foley catheter, nasogastric tube, arterial catheter or central venous catheter for 24 hr, unless absolutely necessary     .  Telemetry     .  Bedside swallow evaluation     .  HOB less than 30 degrees     .  Euglycemia     .  Avoid hyperthermia, PRN acetaminophen     .  DVT prophylaxis     .  Inpatient Neurology Consultation     .  Stroke evaluation as per inpatient neurology recommendations  Discussed with ED physician    ------------------------------------------------------------------------------  History of Present Illness: Patient is a 54 year old Female.  Patient was brought by EMS for symptoms of L hemiparesis, dysarthria  54 yr old woman, with hx of HTN, thyroid disease, who was eating with sister, noted at 1300 sudden onset dysarthria, L sided weakness, and brought to ER by EMS. She has no hx of CVA, no headache, no recent injury, cva, hemorrhage, no bleeding, no cancer, no absolute CI for alteplase.   Past Medical History:     . Hypertension     Examination: BP(142/88), Pulse(101), Blood Glucose(93) 1A: Level of Consciousness - Alert; keenly responsive + 0 1B: Ask Month and Age - Both Questions Right + 0 1C: Blink Eyes & Squeeze Hands - Performs Both Tasks + 0 2: Test Horizontal Extraocular Movements - Partial Gaze Palsy: Can Be Overcome + 1 3: Test Visual Fields - Partial Hemianopia + 1 4: Test Facial Palsy (Use  Grimace if Obtunded) - Partial paralysis (lower face) + 2 5A: Test Left Arm Motor Drift - No Effort Against Gravity + 3 5B: Test Right Arm Motor Drift - No Drift for 10 Seconds + 0 6A: Test Left Leg Motor Drift - Drift, hits bed + 2 6B: Test Right Leg Motor Drift - No Drift for 5 Seconds + 0 7: Test Limb Ataxia (FNF/Heel-Shin) - Ataxia in 1 Limb + 1 8: Test Sensation - Mild-Moderate Loss: Can Sense Being Touched + 1 9: Test Language/Aphasia - Mild-Moderate Aphasia: Some Obvious Changes, Without Significant Limitation + 1 10: Test Dysarthria - Severe Dysarthria: Unintelligble Slurring or Out of Proportion to Aphasia + 2 11: Test Extinction/Inattention - Visual/tactile/auditory/spatial/personal inattention + 1  NIHSS Score: 15  Pre-Morbid Modified Ranking Scale: 0 Points = No symptoms at all   Patient/Family was informed the Neurology Consult would occur via TeleHealth consult by way of interactive audio and video telecommunications and consented to receiving care in this manner.   Patient is being evaluated for possible acute neurologic impairment and high probability of imminent or life-threatening deterioration. I spent total of 25 minutes providing care to this patient, including time for face to face visit via telemedicine, review of medical records, imaging studies and discussion of findings with providers, the patient and/or family.   Dr Rubye Oaks   [facility] [phone]  Case [id number]  Advanced Imaging:  CTA Head and Neck Completed.  CTP  Completed.  LVO:Yes     Discussed with NIR:No        Discussed with NIR Text:     There is large R MCA penumbra and perfusion defect without core infarct, and on CTA- R MCA stenosis, and R M2 occlusion. I d/w Dr Jacqulyn Bath- ER attending my impression and R M2 occlusion with perfusion deficit without core infarct, per hospital protocol. He informs me he will initiate call to [facility] Neurology/NIR, and I am available if  needed. I d/w ER and also Stroke RN.

## 2019-09-03 NOTE — H&P (Signed)
Admission H&P    Chief Complaint: Acute onset of left sided deficits  HPI: Jeanne [name] is a 54 y.o. female who presents in transfer from the [facility] for possible thrombectomy post-tPA. She was seen by Teleneurology at [facility].   Per Teleneurology note: "54 yr old woman with hx of HTN, noted with sudden onset L facial, arm, leg weakness, dysarthria, at 1300, brought by EMS to ER, and NIH 15 for L sided sensory, visual , motor, ataxia, and speech deficits. CT head unremarkable.  Diff Dx; R MCA CVA, other. She is a good candidate for alteplase, the risks, benefits, contraindications reviewed and d.w patient, and sister at bedside. She and sister demonstrate understanding and consent. Benefit given clinical presentation, disabling symptoms outweigh risk of hemorrhage with alteplase. D/w pt, sister, ER. Alteplase bolus administered, no complications, infusion ongoing"  CTA head and neck with CTP was obtained: 1. Suboptimal arterial opacification making intracranial vessel analysis difficult. 2. High-grade stenosis right MCA bifurcation. Occlusion of the superior division right MCA M2 branch. 3. Abnormal CT perfusion with no core infarct but 143 mL of delayed perfusion in the right MCA territory. 4. No significant carotid or vertebral artery stenosis in the neck. 5. Severe cervical spine spondylosis causing significant multilevel spinal and foraminal stenosis.  She was transferred emergently to [facility] for possible thrombectomy. On arrival, NIHSS was 6. She was drowsy to somnolent, suggestive of possible hemorrhage following tPA. STAT repeat CT head was obtained in the VIR suite, revealing no hemorrhage.   She is not on an antiplatelet agent or an anticoagulant at home.   LSN: [id number]  tPA Given: Yes   Past Medical History:  Diagnosis Date  . Allergy   . History of chicken pox   . Hypertension   . Hyperthyroidism     Past Surgical History:  Procedure Laterality Date  . ABDOMINAL HYSTERECTOMY   1994   [place]    Family History  Problem Relation Age of Onset  . Hypertension Maternal Grandmother   . Diabetes Maternal Grandmother   . Dementia Mother    Social History:  reports that she has never smoked. She has never used smokeless tobacco. She reports current alcohol use. She reports that she does not use drugs.  Allergies: No Known Allergies  Medications Prior to Admission  Medication Sig Dispense Refill  . albuterol (PROVENTIL HFA;VENTOLIN HFA) 108 (90 Base) MCG/ACT inhaler INHALE TWO PUFFS BY MOUTH EVERY SIX HOURS AS NEEDED    . Azelastine-Fluticasone (DYMISTA) 137-50 MCG/ACT SUSP Place 1 spray into the nose daily.    . chlorpheniramine (CHLOR-TRIMETON) 4 MG tablet Take 4 mg by mouth 2 (two) times daily as needed for allergies.    . cyclobenzaprine (FLEXERIL) 5 MG tablet Take 1 tablet (5 mg total) by mouth at bedtime. 30 tablet 0  . methimazole (TAPAZOLE) 10 MG tablet Take 10 mg by mouth daily.    . metoprolol (LOPRESSOR) 50 MG tablet Take 1 tablet (50 mg total) by mouth daily. (Patient not taking: Reported on 05/11/2014) 30 tablet 11  . predniSONE (DELTASONE) 20 MG tablet 3 tabs po once day 1, then 2 tabs po qd x 2 days, then 1 tab po qd x 2 days, then half a tab po qd x 2 days 10 tablet 0  . VITAMIN D, CHOLECALCIFEROL, PO Take 4,000 Units by mouth daily.      ROS: Unable to obtain due to somnolence.   Physical Examination: Blood pressure (!) 152/92, pulse 97, temperature 98 F (36.7  C), temperature source Oral, resp. rate (!) 33, height  (1.803 m), weight 125.2 kg, SpO2 96 %.  HEENT-  [place]/AT. Blood on tongue is noted secondary to tongue bite. Lungs - Respirations unlabored Extremities - No edema  Neurologic Examination: Mental Status: Drowsy to somnolent. Speech is dysarthric but fluent. Able to follow all commands. Naming for common objects is intact. Able to count fingers. Oriented to state, year, month and day of the week. Left sided neglect and anosognosia  noted.  Cranial Nerves: II:  Visual fields intact with no extinction to DSS. Left pupil 2 mm and right is 3 mm. Both are briskly reactive.  III,IV, VI: No ptosis. EOMI without nystagmus.  V,VII: Prominent left facial droop. Subjective facial temp sensation equal bilaterally VIII: hearing intact to voice IX,X: Mildly hypophonic XI: Lag on the left with shoulder shrug XII: midline tongue extension  Motor: BUE 5/5 except for 4+/5 finger abduction on the left.  BLE 5/5 No pronator drift.  No leg drift.  Sensory: Decreased temp sensation to LUE and LLE. FT intact x 4 when tested individually. Extinction to LUE and LLE to DSS.   Deep Tendon Reflexes:  2+ bilateral brachioradialis and biceps.  Brisk, low amplitude patellars bilaterally  Plantars: Right: downgoing   Left: downgoing Cerebellar: No ataxia with limited assessment.  Gait: Unable to assess  Results for orders placed or performed during the hospital encounter of 09/03/19 (from the past 48 hour(s))  SARS Coronavirus 2 by RT PCR (hospital order, performed in [facility] hospital lab) Nasopharyngeal Nasopharyngeal Swab     Status: None   Collection Time: 09/03/19  1:27 PM   Specimen: Nasopharyngeal Swab  Result Value Ref Range   SARS Coronavirus 2 NEGATIVE NEGATIVE    Comment: (NOTE) SARS-CoV-2 target nucleic acids are NOT DETECTED.  The SARS-CoV-2 RNA is generally detectable in upper and lower respiratory specimens during the acute phase of infection. The lowest concentration of SARS-CoV-2 viral copies this assay can detect is 250 copies / mL. A negative result does not preclude SARS-CoV-2 infection and should not be used as the sole basis for treatment or other patient management decisions.  A negative result may occur with improper specimen collection / handling, submission of specimen other than nasopharyngeal swab, presence of viral mutation(s) within the areas targeted by this assay, and inadequate number of viral  copies (<250 copies / mL). A negative result must be combined with clinical observations, patient history, and epidemiological information.  Fact Sheet for Patients:   BoilerBrush.com.cy  Fact Sheet for Healthcare Providers: https://pope.com/  This test is not yet approved or  cleared by the [place] FDA and has been authorized for detection and/or diagnosis of SARS-CoV-2 by FDA under an Emergency Use Authorization (EUA).  This EUA will remain in effect (meaning this test can be used) for the duration of the COVID-19 declaration under Section 564(b)(1) of the Act, 21 U.S.C. section 360bbb-3(b)(1), unless the authorization is terminated or revoked sooner.  Performed at [facility], [address], [place], [place] [address]   Ethanol     Status: None   Collection Time: 09/03/19  1:40 PM  Result Value Ref Range   Alcohol, Ethyl (B) <10 <10 mg/dL    Comment: (NOTE) Lowest detectable limit for serum alcohol is 10 mg/dL.  For medical purposes only. Performed at [facility], [address] [place], [place] [address]   CBC     Status: Abnormal   Collection Time: 09/03/19  1:40 PM  Result Value Ref Range   WBC 8.2 4.0 - 10.5 K/uL    Comment: REPEATED TO VERIFY WHITE COUNT CONFIRMED ON SMEAR    RBC 6.00 (H) 3.87 - 5.11 MIL/uL   Hemoglobin 12.1 12.0 - 15.0 g/dL   HCT 40.9 36 - 46 %   MCV 66.0 (L) 80.0 - 100.0 fL   MCH 20.2 (L) 26.0 - 34.0 pg   MCHC 30.6 30.0 - 36.0 g/dL   RDW 81.1 (H) 91.4 - 78.2 %   Platelets 202 150 - 400 K/uL    Comment: SPECIMEN CHECKED FOR CLOTS PLATELET COUNT CONFIRMED BY SMEAR REPEATED TO VERIFY    nRBC 0.0 0.0 - 0.2 %    Comment: REPEATED TO VERIFY Performed at [facility], [address] [place], [place] [address]   Differential     Status: None   Collection Time: 09/03/19  1:40 PM  Result Value Ref Range   Neutrophils Relative % 69 %   Neutro Abs 5.6 1.7 - 7.7 K/uL   Lymphocytes Relative  20 %   Lymphs Abs 1.6 0.7 - 4.0 K/uL   Monocytes Relative 10 %   Monocytes Absolute 0.9 0 - 1 K/uL   Eosinophils Relative 1 %   Eosinophils Absolute 0.1 0 - 0 K/uL   Basophils Relative 0 %   Basophils Absolute 0.0 0 - 0 K/uL   WBC Morphology VACUOLATED NEUTROPHILS    Immature Granulocytes 0 %   Abs Immature Granulocytes 0.01 0.00 - 0.07 K/uL   Polychromasia PRESENT    Spherocytes PRESENT     Comment: Performed at [facility], [address], [place], [place] [address]  Comprehensive metabolic panel     Status: Abnormal   Collection Time: 09/03/19  1:40 PM  Result Value Ref Range   Sodium 139 135 - 145 mmol/L   Potassium 3.7 3.5 - 5.1 mmol/L   Chloride 106 98 - 111 mmol/L   CO2 25 22 - 32 mmol/L   Glucose, Bld 98 70 - 99 mg/dL    Comment: Glucose reference range applies only to samples taken after fasting for at least 8 hours.   BUN 14 6 - 20 mg/dL   Creatinine, Ser 7.84 0.44 - 1.00 mg/dL   Calcium 8.6 (L) 8.9 - 10.3 mg/dL   Total Protein 6.9 6.5 - 8.1 g/dL   Albumin 3.5 3.5 - 5.0 g/dL   AST 17 15 - 41 U/L   ALT 15 0 - 44 U/L   Alkaline Phosphatase 106 38 - 126 U/L   Total Bilirubin 0.6 0.3 - 1.2 mg/dL   GFR calc non Af Amer >60 >60 mL/min   GFR calc Af Amer >60 >60 mL/min   Anion gap 8 5 - 15    Comment: Performed at [facility], [address], [place], [place] [address]  I-stat chem 8, ED     Status: None   Collection Time: 09/03/19  1:45 PM  Result Value Ref Range   Sodium 140 135 - 145 mmol/L   Potassium 4.5 3.5 - 5.1 mmol/L   Chloride 104 98 - 111 mmol/L   BUN 15 6 - 20 mg/dL   Creatinine, Ser 5.28 0.44 - 1.00 mg/dL   Glucose, Bld 95 70 - 99 mg/dL    Comment: Glucose reference range applies only to samples taken after fasting for at least 8 hours.   Calcium, Ion 1.19 1.15 - 1.40 mmol/L   TCO2 30 22 - 32 mmol/L   Hemoglobin 13.6 12.0 -  15.0 g/dL   HCT 16.140.0 36 - 46 %  CBG monitoring, ED     Status: None   Collection Time: 09/03/19  1:51 PM  Result Value Ref  Range   Glucose-Capillary 93 70 - 99 mg/dL    Comment: Glucose reference range applies only to samples taken after fasting for at least 8 hours.  Protime-INR     Status: None   Collection Time: 09/03/19  2:10 PM  Result Value Ref Range   Prothrombin Time 14.7 11.4 - 15.2 seconds   INR 1.2 0.8 - 1.2    Comment: (NOTE) INR goal varies based on device and disease states. Performed at [facility], [address] [place], [place] [address]   APTT     Status: None   Collection Time: 09/03/19  2:10 PM  Result Value Ref Range   aPTT 32 24 - 36 seconds    Comment: Performed at [facility], [address] [place], [place] [address]  CBG monitoring, ED     Status: None   Collection Time: 09/03/19  3:07 PM  Result Value Ref Range   Glucose-Capillary 91 70 - 99 mg/dL    Comment: Glucose reference range applies only to samples taken after fasting for at least 8 hours.   CT Angio Head W or Wo Contrast  Result Date: 09/03/2019 CLINICAL DATA:  Left facial droop. Speech abnormality. Left-sided weakness. Rule out stroke. EXAM: CT ANGIOGRAPHY HEAD AND NECK CT PERFUSION BRAIN TECHNIQUE: Multidetector CT imaging of the head and neck was performed using the standard protocol during bolus administration of intravenous contrast. Multiplanar CT image reconstructions and MIPs were obtained to evaluate the vascular anatomy. Carotid stenosis measurements (when applicable) are obtained utilizing NASCET criteria, using the distal internal carotid diameter as the denominator. Multiphase CT imaging of the brain was performed following IV bolus contrast injection. Subsequent parametric perfusion maps were calculated using RAPID software. CONTRAST:  100mL OMNIPAQUE IOHEXOL 350 MG/ML SOLN COMPARISON:  CT head 09/03/2019 FINDINGS: CTA NECK FINDINGS Aortic arch: Normal aortic arch. Bovine branching pattern. Proximal great vessels widely patent. Right carotid system: Normal right carotid. Negative for stenosis or atherosclerotic  disease. Left carotid system: Normal left carotid. Negative for stenosis or atherosclerotic disease. Vertebral arteries: Normal vertebral arteries bilaterally. Skeleton: Scoliosis. Multilevel disc degeneration and spurring. There is significant spinal stenosis at C3-4, C4-5, and C5-6 with expected cord compression. There is significant foraminal encroachment due to spurring bilaterally at C4-5, C5-6, C6-7, C7-T1. No acute skeletal abnormality. Other neck: Overall the right lobe of the thyroid measures 4.4 x 3.9 x 8.0 cm. This is displacing the trachea to the left. Small left lobe of the thyroid. Upper chest: Lung apices clear bilaterally. Review of the MIP images confirms the above findings CTA HEAD FINDINGS Anterior circulation: Suboptimal arterial opacification intracranially due to timing of the injection. Internal carotid arteries patent bilaterally. There is irregularity which may be related to poor opacification versus atherosclerotic disease. No significant calcification in the cavernous carotid. Anterior cerebral arteries patent bilaterally. Left middle cerebral artery patent without significant stenosis or large vessel occlusion. Vascular irregularity could be due to atherosclerotic disease or artifact. Left M1 segment is patent. There is a high-grade stenosis at the right MCA bifurcation with distal flow. This is a short segment stenosis. There is occlusion of the superior division of the right middle cerebral artery in the M2 segment. Posterior circulation: Both vertebral arteries patent to the basilar. PICA patent bilaterally. Basilar patent. Superior cerebellar and posterior cerebral  arteries patent bilaterally without stenosis. Venous sinuses: Normal venous enhancement Anatomic variants: None Review of the MIP images confirms the above findings CT Brain Perfusion Findings: ASPECTS: 10 CBF (<30%) Volume: 0mL Perfusion (Tmax>6.0s) volume: Mismatch Volume: Infarction Location:Right MCA  territory involving the frontal parietal lobe. There also are patchy areas of artifact in the left hemisphere. IMPRESSION: 1. Suboptimal arterial opacification making intracranial vessel analysis difficult. 2. High-grade stenosis right MCA bifurcation. Occlusion of the superior division right MCA M2 branch. 3. Abnormal CT perfusion with no core infarct but 143 mL of delayed perfusion in the right MCA territory. 4. No significant carotid or vertebral artery stenosis in the neck. 5. Severe cervical spine spondylosis causing significant multilevel spinal and foraminal stenosis. 6. Marked enlargement right lobe of the thyroid with heterogeneous enhancement. Thyroid ultrasound recommended. (Ref: J Am Coll Radiol. 2015 Feb;12(2): 143-50). 7. These results were called by telephone at the time of interpretation on 09/03/2019 at 2:15 pm to provider JOSHUA LONG , who verbally acknowledged these results. Electronically Signed   By: Marlan Palau M.D.   On: 09/03/2019 14:18   CT Angio Neck W and/or Wo Contrast  Result Date: 09/03/2019 CLINICAL DATA:  Left facial droop. Speech abnormality. Left-sided weakness. Rule out stroke. EXAM: CT ANGIOGRAPHY HEAD AND NECK CT PERFUSION BRAIN TECHNIQUE: Multidetector CT imaging of the head and neck was performed using the standard protocol during bolus administration of intravenous contrast. Multiplanar CT image reconstructions and MIPs were obtained to evaluate the vascular anatomy. Carotid stenosis measurements (when applicable) are obtained utilizing NASCET criteria, using the distal internal carotid diameter as the denominator. Multiphase CT imaging of the brain was performed following IV bolus contrast injection. Subsequent parametric perfusion maps were calculated using RAPID software. CONTRAST:  OMNIPAQUE IOHEXOL 350 MG/ML SOLN COMPARISON:  CT head 09/03/2019 FINDINGS: CTA NECK FINDINGS Aortic arch: Normal aortic arch. Bovine branching pattern. Proximal great vessels widely  patent. Right carotid system: Normal right carotid. Negative for stenosis or atherosclerotic disease. Left carotid system: Normal left carotid. Negative for stenosis or atherosclerotic disease. Vertebral arteries: Normal vertebral arteries bilaterally. Skeleton: Scoliosis. Multilevel disc degeneration and spurring. There is significant spinal stenosis at C3-4, C4-5, and C5-6 with expected cord compression. There is significant foraminal encroachment due to spurring bilaterally at C4-5, C5-6, C6-7, C7-T1. No acute skeletal abnormality. Other neck: Overall the right lobe of the thyroid measures 4.4 x 3.9 x 8.0 cm. This is displacing the trachea to the left. Small left lobe of the thyroid. Upper chest: Lung apices clear bilaterally. Review of the MIP images confirms the above findings CTA HEAD FINDINGS Anterior circulation: Suboptimal arterial opacification intracranially due to timing of the injection. Internal carotid arteries patent bilaterally. There is irregularity which may be related to poor opacification versus atherosclerotic disease. No significant calcification in the cavernous carotid. Anterior cerebral arteries patent bilaterally. Left middle cerebral artery patent without significant stenosis or large vessel occlusion. Vascular irregularity could be due to atherosclerotic disease or artifact. Left M1 segment is patent. There is a high-grade stenosis at the right MCA bifurcation with distal flow. This is a short segment stenosis. There is occlusion of the superior division of the right middle cerebral artery in the M2 segment. Posterior circulation: Both vertebral arteries patent to the basilar. PICA patent bilaterally. Basilar patent. Superior cerebellar and posterior cerebral arteries patent bilaterally without stenosis. Venous sinuses: Normal venous enhancement Anatomic variants: None Review of the MIP images confirms the above findings CT Brain Perfusion Findings: ASPECTS: 10  CBF (<30%) Volume: 0mL  Perfusion (Tmax>6.0s) volume: Mismatch Volume: Infarction Location:Right MCA territory involving the frontal parietal lobe. There also are patchy areas of artifact in the left hemisphere. IMPRESSION: 1. Suboptimal arterial opacification making intracranial vessel analysis difficult. 2. High-grade stenosis right MCA bifurcation. Occlusion of the superior division right MCA M2 branch. 3. Abnormal CT perfusion with no core infarct but 143 mL of delayed perfusion in the right MCA territory. 4. No significant carotid or vertebral artery stenosis in the neck. 5. Severe cervical spine spondylosis causing significant multilevel spinal and foraminal stenosis. 6. Marked enlargement right lobe of the thyroid with heterogeneous enhancement. Thyroid ultrasound recommended. (Ref: J Am Coll Radiol. 2015 Feb;12(2): 143-50). 7. These results were called by telephone at the time of interpretation on 09/03/2019 at 2:15 pm to provider JOSHUA LONG , who verbally acknowledged these results. Electronically Signed   By: Marlan Palau M.D.   On: 09/03/2019 14:18   CT CEREBRAL PERFUSION W CONTRAST  Result Date: 09/03/2019 CLINICAL DATA:  Left facial droop. Speech abnormality. Left-sided weakness. Rule out stroke. EXAM: CT ANGIOGRAPHY HEAD AND NECK CT PERFUSION BRAIN TECHNIQUE: Multidetector CT imaging of the head and neck was performed using the standard protocol during bolus administration of intravenous contrast. Multiplanar CT image reconstructions and MIPs were obtained to evaluate the vascular anatomy. Carotid stenosis measurements (when applicable) are obtained utilizing NASCET criteria, using the distal internal carotid diameter as the denominator. Multiphase CT imaging of the brain was performed following IV bolus contrast injection. Subsequent parametric perfusion maps were calculated using RAPID software. CONTRAST:  OMNIPAQUE IOHEXOL 350 MG/ML SOLN COMPARISON:  CT head 09/03/2019 FINDINGS: CTA NECK FINDINGS  Aortic arch: Normal aortic arch. Bovine branching pattern. Proximal great vessels widely patent. Right carotid system: Normal right carotid. Negative for stenosis or atherosclerotic disease. Left carotid system: Normal left carotid. Negative for stenosis or atherosclerotic disease. Vertebral arteries: Normal vertebral arteries bilaterally. Skeleton: Scoliosis. Multilevel disc degeneration and spurring. There is significant spinal stenosis at C3-4, C4-5, and C5-6 with expected cord compression. There is significant foraminal encroachment due to spurring bilaterally at C4-5, C5-6, C6-7, C7-T1. No acute skeletal abnormality. Other neck: Overall the right lobe of the thyroid measures 4.4 x 3.9 x 8.0 cm. This is displacing the trachea to the left. Small left lobe of the thyroid. Upper chest: Lung apices clear bilaterally. Review of the MIP images confirms the above findings CTA HEAD FINDINGS Anterior circulation: Suboptimal arterial opacification intracranially due to timing of the injection. Internal carotid arteries patent bilaterally. There is irregularity which may be related to poor opacification versus atherosclerotic disease. No significant calcification in the cavernous carotid. Anterior cerebral arteries patent bilaterally. Left middle cerebral artery patent without significant stenosis or large vessel occlusion. Vascular irregularity could be due to atherosclerotic disease or artifact. Left M1 segment is patent. There is a high-grade stenosis at the right MCA bifurcation with distal flow. This is a short segment stenosis. There is occlusion of the superior division of the right middle cerebral artery in the M2 segment. Posterior circulation: Both vertebral arteries patent to the basilar. PICA patent bilaterally. Basilar patent. Superior cerebellar and posterior cerebral arteries patent bilaterally without stenosis. Venous sinuses: Normal venous enhancement Anatomic variants: None Review of the MIP images  confirms the above findings CT Brain Perfusion Findings: ASPECTS: 10 CBF (<30%) Volume: 0mL Perfusion (Tmax>6.0s) volume: Mismatch Volume: Infarction Location:Right MCA territory involving the frontal parietal lobe. There also are patchy areas of artifact in the  left hemisphere. IMPRESSION: 1. Suboptimal arterial opacification making intracranial vessel analysis difficult. 2. High-grade stenosis right MCA bifurcation. Occlusion of the superior division right MCA M2 branch. 3. Abnormal CT perfusion with no core infarct but 143 mL of delayed perfusion in the right MCA territory. 4. No significant carotid or vertebral artery stenosis in the neck. 5. Severe cervical spine spondylosis causing significant multilevel spinal and foraminal stenosis. 6. Marked enlargement right lobe of the thyroid with heterogeneous enhancement. Thyroid ultrasound recommended. (Ref: J Am Coll Radiol. 2015 Feb;12(2): 143-50). 7. These results were called by telephone at the time of interpretation on 09/03/2019 at 2:15 pm to provider JOSHUA LONG , who verbally acknowledged these results. Electronically Signed   By: Marlan Palau M.D.   On: 09/03/2019 14:18   CT HEAD CODE STROKE WO CONTRAST  Result Date: 09/03/2019 CLINICAL DATA:  Code stroke. Left-sided weakness with paralysis. Acute onset. EXAM: CT HEAD WITHOUT CONTRAST TECHNIQUE: Contiguous axial images were obtained from the base of the skull through the vertex without intravenous contrast. COMPARISON:  None. FINDINGS: Brain: The study suffers from some motion degradation. No sign of brain atrophy, old or acute infarction, mass lesion, hemorrhage, hydrocephalus or extra-axial collection. Patient has a tendency towards dural calcification. Vascular: No abnormal vascular finding. Skull: Negative Sinuses/Orbits: Clear/normal Other: None ASPECTS (Alberta Stroke Program Early CT Score) - Ganglionic level infarction (caudate, lentiform nuclei, internal capsule, insula, M1-M3 cortex):  7 - Supraganglionic infarction (M4-M6 cortex): 3 Total score (0-10 with 10 being normal): 10 IMPRESSION: 1. Normal head CT 2. ASPECTS is 10 3. These results were called by telephone at the time of interpretation on 09/03/2019 at 1:35 pm to provider JOSHUA LONG , who verbally acknowledged these results. Electronically Signed   By: Paulina Fusi M.D.   On: 09/03/2019 13:36    Assessment: 54 y.o. female presenting with acute onset of left sided deficits and slurred speech. She is status-post tPA at [facility]. [facility] was [facility] to [facility] for possible thrombectomy.  1. CT head with no hemorrhage. Received IV tPA at the [facility].  2. CTA/CTP was obtained after tPA infusion was started:  High-grade stenosis right MCA bifurcation. Occlusion of the superior division right MCA M2 branch. Abnormal CT perfusion with no core infarct but 143 mL of delayed perfusion in the right MCA territory. No significant carotid or vertebral artery stenosis in the neck. Severe cervical spine spondylosis causing significant multilevel spinal and foraminal stenosis. 3. Also seen on CTA: Marked enlargement right lobe of the thyroid with heterogeneous enhancement. Thyroid ultrasound recommended  4. Stroke Risk Factors - HTN and morbid obesity 5. The patient is a VIR candidate on an emergent basis. Dr. Corliss Skains and myself have discussed extensively the risks/benefits of the procedure. With VIR, she has an approximately 50% chance of significant clinical neurological improvement, but with an associated approximate 10% chance of subarachnoid hemorrhage with associated possibility of significant worsening including death. 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 Neither legally-defined next of kin nor the patient's significant other were available to obtain consent despite attempt to reach by telephone. The patient fits criteria for emergent two-physician decision-making regarding emergency treatment  for her left M2 branch acute occlusion, which is potentially life-threatening due to the high risk for massive stroke in addition to a high risk for severe long-term morbidity from left sided neglect and anosognosia if she survives without treatment.    Plan: 1. Admitting to Neuro ICU under  the Neurology service after tPA and VIR.  2. Post-VIR and tPA order set to include frequent neuro checks and BP management.  3. No antiplatelet medications or anticoagulants for at least 24 hours following tPA and VIR.  4. DVT prophylaxis with SCDs.  5. Will need to be started on a statin.  6. Will need to start antiplatelet therapy if follow up CT at 24 hours is negative for hemorrhagic conversion. 7. TTE.  8. Telemetry monitoring. 9. MRI brain.  10. PT/OT/Speech.  11. NPO until passes swallow evaluation.  12. Fasting lipid panel, HgbA1c 13. Continue methimazole 14. Thyroid ultrasound.   50 minutes spent in the emergent neurological evaluation and management of this critically ill patient.   Electronically signed: Dr. Caryl Pina 09/03/2019, 3:53 PM

## 2019-09-03 NOTE — ED Provider Notes (Addendum)
Emergency Department Provider Note   I have reviewed the triage vital signs and the nursing notes.   HISTORY  Chief Complaint Code Stroke   HPI Jeanne [name] is a 54 y.o. female with PMH of HTN presents to the emergency department by EMS as a code stroke.  Patient was eating with someone at a restaurant when she was suddenly developed left face droop with difficulty speaking.  She had significant weakness on the left as well and EMS were called at the scene.  They placed an IV and transported the patient as a code stroke to the emergency department.  Patient is dysarthric and unable to provide significant history. Level 5 caveat applies with acute stroke symptoms.    Past Medical History:  Diagnosis Date  . Allergy   . History of chicken pox   . Hypertension   . Hyperthyroidism     Patient Active Problem List   Diagnosis Date Noted  . Left-sided low back pain without sciatica 05/12/2014  . Viral URI with cough 05/12/2014  . HTN (hypertension) 07/09/2013  . Hyperthyroidism 07/09/2013    Past Surgical History:  Procedure Laterality Date  . ABDOMINAL HYSTERECTOMY  1994   [place]    Allergies Patient has no known allergies.  Family History  Problem Relation Age of Onset  . Hypertension Maternal Grandmother   . Diabetes Maternal Grandmother   . Dementia Mother     Social History Social History   Tobacco Use  . Smoking status: Never Smoker  . Smokeless tobacco: Never Used  Substance Use Topics  . Alcohol use: Yes    Comment: socially  . Drug use: No    Review of Systems  Level 5 caveat: Acute stroke and dysarthria limiting communication  ____________________________________________   PHYSICAL EXAM:  VITAL SIGNS: Vitals:   09/03/19 1417 09/03/19 1434  BP: (!) 163/78 (!) 152/96  Pulse: (!) 49   Resp: (!) 45 (!) 38  Temp:    SpO2: 100% 100%    Constitutional: Alert and responding verbally but dysarthric. Right gaze preference.  Eyes:  Conjunctivae are normal. Left visual field neglect.  Head: Atraumatic. Nose: No congestion/rhinnorhea. Mouth/Throat: Mucous membranes are moist. Neck: No stridor.   Cardiovascular: Normal rate, regular rhythm. Good peripheral circulation. Grossly normal heart sounds.   Respiratory: Normal respiratory effort.  No retractions. Lungs CTAB. Gastrointestinal: Soft and nontender. No distention.  Musculoskeletal: No gross deformities of extremities. Neurologic: Dysarthria with right gaze preference and dense left hemiplegia.  Skin:  Skin is warm, dry and intact. No rash noted.   ____________________________________________   LABS (all labs ordered are listed, but only abnormal results are displayed)  Labs Reviewed  CBC - Abnormal; Notable for the following components:      Result Value   RBC 6.00 (*)    MCV 66.0 (*)    MCH 20.2 (*)    RDW 22.7 (*)    All other components within normal limits  COMPREHENSIVE METABOLIC PANEL - Abnormal; Notable for the following components:   Calcium 8.6 (*)    All other components within normal limits  SARS CORONAVIRUS 2 BY RT PCR (HOSPITAL ORDER, PERFORMED IN [facility])  DIFFERENTIAL  PROTIME-INR  APTT  ETHANOL  RAPID URINE DRUG SCREEN, HOSP PERFORMED  URINALYSIS, ROUTINE W REFLEX MICROSCOPIC  I-STAT CHEM 8, ED  CBG MONITORING, ED  POC URINE PREG, ED   ____________________________________________  EKG   EKG Interpretation  Date/Time:  Friday September 03 2019 13:56:07 EDT Ventricular  Rate:  108 PR Interval:    QRS Duration: 97 QT Interval:  358 QTC Calculation: 480 R Axis:   49 Text Interpretation: Sinus tachycardia Multiple premature complexes, vent & supraven Consider anterior infarct No prior for comparison No STEMI Confirmed by Alona Bene [phone]) on 09/03/2019 2:09:09 PM       ____________________________________________  RADIOLOGY  CT Angio Head W or Wo Contrast  Result Date: 09/03/2019 CLINICAL DATA:  Left facial  droop. Speech abnormality. Left-sided weakness. Rule out stroke. EXAM: CT ANGIOGRAPHY HEAD AND NECK CT PERFUSION BRAIN TECHNIQUE: Multidetector CT imaging of the head and neck was performed using the standard protocol during bolus administration of intravenous contrast. Multiplanar CT image reconstructions and MIPs were obtained to evaluate the vascular anatomy. Carotid stenosis measurements (when applicable) are obtained utilizing NASCET criteria, using the distal internal carotid diameter as the denominator. Multiphase CT imaging of the brain was performed following IV bolus contrast injection. Subsequent parametric perfusion maps were calculated using RAPID software. CONTRAST:  OMNIPAQUE IOHEXOL 350 MG/ML SOLN COMPARISON:  CT head 09/03/2019 FINDINGS: CTA NECK FINDINGS Aortic arch: Normal aortic arch. Bovine branching pattern. Proximal great vessels widely patent. Right carotid system: Normal right carotid. Negative for stenosis or atherosclerotic disease. Left carotid system: Normal left carotid. Negative for stenosis or atherosclerotic disease. Vertebral arteries: Normal vertebral arteries bilaterally. Skeleton: Scoliosis. Multilevel disc degeneration and spurring. There is significant spinal stenosis at C3-4, C4-5, and C5-6 with expected cord compression. There is significant foraminal encroachment due to spurring bilaterally at C4-5, C5-6, C6-7, C7-T1. No acute skeletal abnormality. Other neck: Overall the right lobe of the thyroid measures 4.4 x 3.9 x 8.0 cm. This is displacing the trachea to the left. Small left lobe of the thyroid. Upper chest: Lung apices clear bilaterally. Review of the MIP images confirms the above findings CTA HEAD FINDINGS Anterior circulation: Suboptimal arterial opacification intracranially due to timing of the injection. Internal carotid arteries patent bilaterally. There is irregularity which may be related to poor opacification versus atherosclerotic disease. No significant  calcification in the cavernous carotid. Anterior cerebral arteries patent bilaterally. Left middle cerebral artery patent without significant stenosis or large vessel occlusion. Vascular irregularity could be due to atherosclerotic disease or artifact. Left M1 segment is patent. There is a high-grade stenosis at the right MCA bifurcation with distal flow. This is a short segment stenosis. There is occlusion of the superior division of the right middle cerebral artery in the M2 segment. Posterior circulation: Both vertebral arteries patent to the basilar. PICA patent bilaterally. Basilar patent. Superior cerebellar and posterior cerebral arteries patent bilaterally without stenosis. Venous sinuses: Normal venous enhancement Anatomic variants: None Review of the MIP images confirms the above findings CT Brain Perfusion Findings: ASPECTS: 10 CBF (<30%) Volume: 0mL Perfusion (Tmax>6.0s) volume: Mismatch Volume: Infarction Location:Right MCA territory involving the frontal parietal lobe. There also are patchy areas of artifact in the left hemisphere. IMPRESSION: 1. Suboptimal arterial opacification making intracranial vessel analysis difficult. 2. High-grade stenosis right MCA bifurcation. Occlusion of the superior division right MCA M2 branch. 3. Abnormal CT perfusion with no core infarct but 143 mL of delayed perfusion in the right MCA territory. 4. No significant carotid or vertebral artery stenosis in the neck. 5. Severe cervical spine spondylosis causing significant multilevel spinal and foraminal stenosis. 6. Marked enlargement right lobe of the thyroid with heterogeneous enhancement. Thyroid ultrasound recommended. (Ref: J Am Coll Radiol. 2015 Feb;12(2): 143-50). 7. These results were called by telephone at the time  of interpretation on 09/03/2019 at 2:15 pm to provider Garek Schuneman , who verbally acknowledged these results. Electronically Signed   By: Marlan Palau M.D.   On: 09/03/2019 14:18   CT  Angio Neck W and/or Wo Contrast  Result Date: 09/03/2019 CLINICAL DATA:  Left facial droop. Speech abnormality. Left-sided weakness. Rule out stroke. EXAM: CT ANGIOGRAPHY HEAD AND NECK CT PERFUSION BRAIN TECHNIQUE: Multidetector CT imaging of the head and neck was performed using the standard protocol during bolus administration of intravenous contrast. Multiplanar CT image reconstructions and MIPs were obtained to evaluate the vascular anatomy. Carotid stenosis measurements (when applicable) are obtained utilizing NASCET criteria, using the distal internal carotid diameter as the denominator. Multiphase CT imaging of the brain was performed following IV bolus contrast injection. Subsequent parametric perfusion maps were calculated using RAPID software. CONTRAST:  OMNIPAQUE IOHEXOL 350 MG/ML SOLN COMPARISON:  CT head 09/03/2019 FINDINGS: CTA NECK FINDINGS Aortic arch: Normal aortic arch. Bovine branching pattern. Proximal great vessels widely patent. Right carotid system: Normal right carotid. Negative for stenosis or atherosclerotic disease. Left carotid system: Normal left carotid. Negative for stenosis or atherosclerotic disease. Vertebral arteries: Normal vertebral arteries bilaterally. Skeleton: Scoliosis. Multilevel disc degeneration and spurring. There is significant spinal stenosis at C3-4, C4-5, and C5-6 with expected cord compression. There is significant foraminal encroachment due to spurring bilaterally at C4-5, C5-6, C6-7, C7-T1. No acute skeletal abnormality. Other neck: Overall the right lobe of the thyroid measures 4.4 x 3.9 x 8.0 cm. This is displacing the trachea to the left. Small left lobe of the thyroid. Upper chest: Lung apices clear bilaterally. Review of the MIP images confirms the above findings CTA HEAD FINDINGS Anterior circulation: Suboptimal arterial opacification intracranially due to timing of the injection. Internal carotid arteries patent bilaterally. There is irregularity  which may be related to poor opacification versus atherosclerotic disease. No significant calcification in the cavernous carotid. Anterior cerebral arteries patent bilaterally. Left middle cerebral artery patent without significant stenosis or large vessel occlusion. Vascular irregularity could be due to atherosclerotic disease or artifact. Left M1 segment is patent. There is a high-grade stenosis at the right MCA bifurcation with distal flow. This is a short segment stenosis. There is occlusion of the superior division of the right middle cerebral artery in the M2 segment. Posterior circulation: Both vertebral arteries patent to the basilar. PICA patent bilaterally. Basilar patent. Superior cerebellar and posterior cerebral arteries patent bilaterally without stenosis. Venous sinuses: Normal venous enhancement Anatomic variants: None Review of the MIP images confirms the above findings CT Brain Perfusion Findings: ASPECTS: 10 CBF (<30%) Volume: 14mL Perfusion (Tmax>6.0s) volume: Mismatch Volume: Infarction Location:Right MCA territory involving the frontal parietal lobe. There also are patchy areas of artifact in the left hemisphere. IMPRESSION: 1. Suboptimal arterial opacification making intracranial vessel analysis difficult. 2. High-grade stenosis right MCA bifurcation. Occlusion of the superior division right MCA M2 branch. 3. Abnormal CT perfusion with no core infarct but 143 mL of delayed perfusion in the right MCA territory. 4. No significant carotid or vertebral artery stenosis in the neck. 5. Severe cervical spine spondylosis causing significant multilevel spinal and foraminal stenosis. 6. Marked enlargement right lobe of the thyroid with heterogeneous enhancement. Thyroid ultrasound recommended. (Ref: J Am Coll Radiol. 2015 Feb;12(2): 143-50). 7. These results were called by telephone at the time of interpretation on 09/03/2019 at 2:15 pm to provider Odelia Graciano , who verbally acknowledged these  results. Electronically Signed   By: Marlan Palau M.D.  On: 09/03/2019 14:18   CT CEREBRAL PERFUSION W CONTRAST  Result Date: 09/03/2019 CLINICAL DATA:  Left facial droop. Speech abnormality. Left-sided weakness. Rule out stroke. EXAM: CT ANGIOGRAPHY HEAD AND NECK CT PERFUSION BRAIN TECHNIQUE: Multidetector CT imaging of the head and neck was performed using the standard protocol during bolus administration of intravenous contrast. Multiplanar CT image reconstructions and MIPs were obtained to evaluate the vascular anatomy. Carotid stenosis measurements (when applicable) are obtained utilizing NASCET criteria, using the distal internal carotid diameter as the denominator. Multiphase CT imaging of the brain was performed following IV bolus contrast injection. Subsequent parametric perfusion maps were calculated using RAPID software. CONTRAST:  100mL OMNIPAQUE IOHEXOL 350 MG/ML SOLN COMPARISON:  CT head 09/03/2019 FINDINGS: CTA NECK FINDINGS Aortic arch: Normal aortic arch. Bovine branching pattern. Proximal great vessels widely patent. Right carotid system: Normal right carotid. Negative for stenosis or atherosclerotic disease. Left carotid system: Normal left carotid. Negative for stenosis or atherosclerotic disease. Vertebral arteries: Normal vertebral arteries bilaterally. Skeleton: Scoliosis. Multilevel disc degeneration and spurring. There is significant spinal stenosis at C3-4, C4-5, and C5-6 with expected cord compression. There is significant foraminal encroachment due to spurring bilaterally at C4-5, C5-6, C6-7, C7-T1. No acute skeletal abnormality. Other neck: Overall the right lobe of the thyroid measures 4.4 x 3.9 x 8.0 cm. This is displacing the trachea to the left. Small left lobe of the thyroid. Upper chest: Lung apices clear bilaterally. Review of the MIP images confirms the above findings CTA HEAD FINDINGS Anterior circulation: Suboptimal arterial opacification intracranially due to timing of  the injection. Internal carotid arteries patent bilaterally. There is irregularity which may be related to poor opacification versus atherosclerotic disease. No significant calcification in the cavernous carotid. Anterior cerebral arteries patent bilaterally. Left middle cerebral artery patent without significant stenosis or large vessel occlusion. Vascular irregularity could be due to atherosclerotic disease or artifact. Left M1 segment is patent. There is a high-grade stenosis at the right MCA bifurcation with distal flow. This is a short segment stenosis. There is occlusion of the superior division of the right middle cerebral artery in the M2 segment. Posterior circulation: Both vertebral arteries patent to the basilar. PICA patent bilaterally. Basilar patent. Superior cerebellar and posterior cerebral arteries patent bilaterally without stenosis. Venous sinuses: Normal venous enhancement Anatomic variants: None Review of the MIP images confirms the above findings CT Brain Perfusion Findings: ASPECTS: 10 CBF (<30%) Volume: 0mL Perfusion (Tmax>6.0s) volume: 143mL Mismatch Volume: 143mL Infarction Location:Right MCA territory involving the frontal parietal lobe. There also are patchy areas of artifact in the left hemisphere. IMPRESSION: 1. Suboptimal arterial opacification making intracranial vessel analysis difficult. 2. High-grade stenosis right MCA bifurcation. Occlusion of the superior division right MCA M2 branch. 3. Abnormal CT perfusion with no core infarct but 143 mL of delayed perfusion in the right MCA territory. 4. No significant carotid or vertebral artery stenosis in the neck. 5. Severe cervical spine spondylosis causing significant multilevel spinal and foraminal stenosis. 6. Marked enlargement right lobe of the thyroid with heterogeneous enhancement. Thyroid ultrasound recommended. (Ref: J Am Coll Radiol. 2015 Feb;12(2): 143-50). 7. These results were called by telephone at the time of  interpretation on 09/03/2019 at 2:15 pm to provider Muaad Boehning , who verbally acknowledged these results. Electronically Signed   By: Marlan Palauharles  Clark M.D.   On: 09/03/2019 14:18   CT HEAD CODE STROKE WO CONTRAST  Result Date: 09/03/2019 CLINICAL DATA:  Code stroke. Left-sided weakness with paralysis. Acute onset. EXAM: CT HEAD WITHOUT  CONTRAST TECHNIQUE: Contiguous axial images were obtained from the base of the skull through the vertex without intravenous contrast. COMPARISON:  None. FINDINGS: Brain: The study suffers from some motion degradation. No sign of brain atrophy, old or acute infarction, mass lesion, hemorrhage, hydrocephalus or extra-axial collection. Patient has a tendency towards dural calcification. Vascular: No abnormal vascular finding. Skull: Negative Sinuses/Orbits: Clear/normal Other: None ASPECTS (Alberta Stroke Program Early CT Score) - Ganglionic level infarction (caudate, lentiform nuclei, internal capsule, insula, M1-M3 cortex): 7 - Supraganglionic infarction (M4-M6 cortex): 3 Total score (0-10 with 10 being normal): 10 IMPRESSION: 1. Normal head CT 2. ASPECTS is 10 3. These results were called by telephone at the time of interpretation on 09/03/2019 at 1:35 pm to provider Javayah Magaw , who verbally acknowledged these results. Electronically Signed   By: Paulina Fusi M.D.   On: 09/03/2019 13:36    ____________________________________________   PROCEDURES  Procedure(s) performed:   .Critical Care Performed by: Maia Plan, MD Authorized by: Maia Plan, MD   Critical care provider statement:    Critical care time (minutes):  45   Critical care time was exclusive of:  Separately billable procedures and treating other patients and teaching time   Critical care was necessary to treat or prevent imminent or life-threatening deterioration of the following conditions:  CNS failure or compromise   Critical care was time spent personally by me on the following activities:   Blood draw for specimens, development of treatment plan with patient or surrogate, discussions with consultants, evaluation of patient's response to treatment, examination of patient, obtaining history from patient or surrogate, ordering and performing treatments and interventions, ordering and review of laboratory studies, ordering and review of radiographic studies, pulse oximetry, re-evaluation of patient's condition and review of old charts   I assumed direction of critical care for this patient from another provider in my specialty: no    ____________________________________________   INITIAL IMPRESSION / ASSESSMENT AND PLAN / ED COURSE  Pertinent labs & imaging results that were available during my care of the patient were reviewed by me and considered in my medical decision making (see chart for details).   Patient presents to the emergency department as a code stroke.  She has right gaze preference with dense left hemiplegia.  Patient taken directly to CT on EMS stretcher.  Placed orders for Noncon head CT as well as CTA head, neck, perfusion given her significant deficits.  Labs and Covid swab ordered and patient will see teleneurology ASAP once back from CT.   Spoke with tele-neuro after their exam. They have initiate tPA. Labetalol given to control BP initially. Patient is speaking and following commands although is dysarthric. CTA images pending but performed. Tele-neuro is following as well.   02:15 PM  Spoke with radiology after reading the perfusion and CTA.  Patient has a large perfusion deficit in the MCA territory on the right which correlates with exam.  Area of high-grade stenosis noted as well as possible thrombus on CTA.  I have paged neuro [facility]al radi[facility] at [facility].  Have paged neurology at [facility] previously after discussion with teleneurology.  They have started tPA. Patient remains awake and alert.   02:30 PM  Spoke with Dr. Corliss Skains regarding Neuro IR. Recommends  ASAP ED-ED transfer for neuro IR.  Spoke with Dr. Otelia Limes with neurology accept the patient in ED to ED transfer.  [facility] truck is already in route to the [facility] ED.   02:40 PM  Patient re-evaluated  and mental status unchanged. Slightly drowsy but easily awakens to voice. She is following commands and speaking but dysarthric. She can identify her daughter at bedside. Updated on plan for emergent transfer to [facility] ED for IR. Patient and family in agreement. COVID in process.   Discussed patient's case with Neurology to request admission. Patient and family (if present) updated with plan.  I reviewed all nursing notes, vitals, pertinent old records, EKGs, labs, imaging (as available).  ____________________________________________  FINAL CLINICAL IMPRESSION(S) / ED DIAGNOSES  Final diagnoses:  Acute ischemic stroke (HCC)  Left-sided weakness  Dysarthria    MEDICATIONS GIVEN DURING THIS VISIT:  Medications  alteplase (ACTIVASE) 1 mg/mL infusion 90 mg (90 mg Intravenous New Bag/Given 09/03/19 1359)    Followed by  0.9 %  sodium chloride infusion (has no administration in time range)  labetalol (NORMODYNE) injection 5 mg (has no administration in time range)  iohexol (OMNIPAQUE) 350 MG/ML injection 100 mL (100 mLs Intravenous Contrast Given 09/03/19 1337)    Note:  This document was prepared using Dragon voice recognition software and may include unintentional dictation errors.  Alona Bene, MD, [facility] Emergency Medicine    Alvine Mostafa, Arlyss Repress, MD 09/03/19 1446    Maia Plan, MD 09/03/19 1447

## 2019-09-03 NOTE — ED Notes (Addendum)
Per sister- pt was attempting to eat a cheesestick at [employer] and could not keep in mouth. Pt had left sided facial droop. Unable to move left arm and left leg. Pt speech incomprehensible.   Earlier in day pt was c/o lethargy. Has been working in heat at job.

## 2019-09-03 NOTE — Anesthesia Procedure Notes (Signed)
Arterial Line Insertion Start/End7/04/2019 3:40 PM, 09/03/2019 3:55 PM Performed by: Val Eagle, MD, anesthesiologist  Patient location: Pre-op. Lidocaine 1% used for infiltration Left, radial was placed Catheter size: 20 G Hand hygiene performed , maximum sterile barriers used  and Seldinger technique used  Attempts: 2 Procedure performed without using ultrasound guided technique. Following insertion, dressing applied and Biopatch. Post procedure assessment: normal  Patient tolerated the procedure well with no immediate complications. Additional procedure comments: Attempt x1 - Chartered certified accountant.

## 2019-09-03 NOTE — Sedation Documentation (Signed)
Patient transported to PACU with CRNA. Bedside report given to PACU RN. Pulses intact distally and groin site assessed. Soft to palpation, no hematoma noted.

## 2019-09-03 NOTE — Transfer of Care (Signed)
Immediate Anesthesia Transfer of Care Note  Patient: [facility]  Procedure(s) Performed: IR WITH ANESTHESIA (N/A )  Patient Location: PACU  Anesthesia Type:General  Level of Consciousness: awake and patient cooperative  Airway & Oxygen Therapy: Patient Spontanous Breathing and Patient connected to nasal cannula oxygen  Post-op Assessment: Report given to RN, Post -op Vital signs reviewed and stable and Patient moving all extremities  Post vital signs: Reviewed and stable  Last Vitals:  Vitals Value Taken Time  BP 122/70 09/03/19 1701  Temp 36.6 C 09/03/19 1700  Pulse 81 09/03/19 1714  Resp 24 09/03/19 1714  SpO2 100 % 09/03/19 1714  Vitals shown include unvalidated device data.  Last Pain:  Vitals:   09/03/19 1700  TempSrc:   PainSc: 0-No pain         Complications: No complications documented.

## 2019-09-03 NOTE — Anesthesia Procedure Notes (Signed)
Procedure Name: Intubation Date/Time: 09/03/2019 3:50 PM Performed by: Shireen Quan, CRNA Pre-anesthesia Checklist: Patient identified, Emergency Drugs available, Suction available and Patient being monitored Patient Re-evaluated:Patient Re-evaluated prior to induction Oxygen Delivery Method: Circle System Utilized Preoxygenation: Pre-oxygenation with 100% oxygen Induction Type: IV induction and Cricoid Pressure applied Ventilation: Mask ventilation without difficulty Laryngoscope Size: Miller and 3 Grade View: Grade I Tube type: Oral Tube size: 7.5 mm Number of attempts: 1 Airway Equipment and Method: Stylet Placement Confirmation: ETT inserted through vocal cords under direct vision,  positive ETCO2 and breath sounds checked- equal and bilateral Secured at: 20 cm Tube secured with: Tape Dental Injury: Teeth and Oropharynx as per pre-operative assessment

## 2019-09-03 NOTE — Procedures (Addendum)
S/P RT and Lt common carotid ,and RT vertebral arteriograms . RT CFA approach. Findings . 1.No LVO. 2 Occlusion of ? Angular branch  of  Inferior division of RT  MCA in the M4 region. 3.Approx 2.8 mm x 2.4 mm  Outpouching  In the right COM region? aneurysm. S.Daishawn Lauf MD  Patient extubated. Moving all 4s with mild Lt UE prox weakness. Following simple commands appropriately.  RT groin soft  Distal pulses all palpable. S.Grizel Vesely MD

## 2019-09-03 NOTE — ED Notes (Signed)
Pushed button on stroke cart.

## 2019-09-03 NOTE — Progress Notes (Signed)
Pt requests that Significant Other Carollee Herter [place] be listed as her next of kin and her surrogate decision maker if needed.

## 2019-09-03 NOTE — Progress Notes (Signed)
Pt arrived to 4 No ICU 1810, drowsy but follows commands, oriented.

## 2019-09-03 NOTE — Progress Notes (Signed)
Order clarification from Dr Otelia Limes: keep BP 120-160 per Dr Corliss Skains orders even though no intervention was done.

## 2019-09-03 NOTE — ED Triage Notes (Signed)
Pt was at [employer] with sister. Sister states pt started to have left sided facial droop and was unable to understand her speak.    LKW 1300. Ed present. Code stroke called.   Present with left sided facial droop, weakness in left arm and leg.

## 2019-09-03 NOTE — Code Documentation (Signed)
Jeanne [name] arrived at [facility] from [facility] via [facility] at 1532. Pt was LKW at 1300 when she had a sudden Left sided weakness, L facial palsy and slurred speech. (This caused her to bite her tongue as she was eating lunch). She was taken to [facility] where she she had CT,  CTA, and CTP. CTH was negative for hemorrhage and CTA/P showed Rt M2 MCA occlusion, with no core and a large penumbra. 90 mg IV TPA was given at [facility] at 1359. Code IR was called at 1440. When pt arrived at [facility], her NIHSS was a 6, for drowsiness, Left facial droop, Lt sensory decrease, dysarthria, and extinction (left side extinguishes to double stimulation). She did not exhibit any hemiplegia. Her mouth was bloody (presumably from bite). Pt was briefly examined and then taken to IR suite directly (no bay 8 as she is Covid negative). Two MD consent obtained as no family contact could be made. Pt arrived in IR at 1541. Report given to Ronaldo Miyamoto, Charity fundraiser.

## 2019-09-04 ENCOUNTER — Inpatient Hospital Stay (HOSPITAL_COMMUNITY): Payer: Self-pay

## 2019-09-04 DIAGNOSIS — I6389 Other cerebral infarction: Secondary | ICD-10-CM

## 2019-09-04 LAB — CBC
HCT: 36.7 % (ref 36.0–46.0)
Hemoglobin: 11.4 g/dL — ABNORMAL LOW (ref 12.0–15.0)
MCH: 20 pg — ABNORMAL LOW (ref 26.0–34.0)
MCHC: 31.1 g/dL (ref 30.0–36.0)
MCV: 64.5 fL — ABNORMAL LOW (ref 80.0–100.0)
Platelets: 184 10*3/uL (ref 150–400)
RBC: 5.69 MIL/uL — ABNORMAL HIGH (ref 3.87–5.11)
RDW: 21.9 % — ABNORMAL HIGH (ref 11.5–15.5)
WBC: 10.2 10*3/uL (ref 4.0–10.5)
nRBC: 0 % (ref 0.0–0.2)

## 2019-09-04 LAB — BASIC METABOLIC PANEL
Anion gap: 9 (ref 5–15)
BUN: 10 mg/dL (ref 6–20)
CO2: 21 mmol/L — ABNORMAL LOW (ref 22–32)
Calcium: 8.7 mg/dL — ABNORMAL LOW (ref 8.9–10.3)
Chloride: 108 mmol/L (ref 98–111)
Creatinine, Ser: 0.6 mg/dL (ref 0.44–1.00)
GFR calc Af Amer: >60 mL/min (ref >60)
GFR calc non Af Amer: >60 mL/min (ref >60)
Glucose, Bld: 170 mg/dL — ABNORMAL HIGH (ref 70–99)
Potassium: 3.3 mmol/L — ABNORMAL LOW (ref 3.5–5.1)
Sodium: 138 mmol/L (ref 135–145)

## 2019-09-04 LAB — LIPID PANEL
Cholesterol: 100 mg/dL (ref 0–200)
HDL: 43 mg/dL (ref >40)
LDL Cholesterol: 51 mg/dL (ref 0–99)
Total CHOL/HDL Ratio: 2.3 RATIO
Triglycerides: 31 mg/dL (ref <150)
VLDL: 6 mg/dL (ref 0–40)

## 2019-09-04 LAB — ECHOCARDIOGRAM COMPLETE
Height: 71 in
Weight: 4417.6 oz

## 2019-09-04 LAB — HIV ANTIBODY (ROUTINE TESTING W REFLEX): HIV Screen 4th Generation wRfx: NONREACTIVE

## 2019-09-04 MED ORDER — ACETAMINOPHEN 160 MG/5ML PO SOLN: 650.0000 mg | ORAL | Status: DC | PRN

## 2019-09-04 MED ORDER — ACETAMINOPHEN 650 MG RE SUPP: 650.0000 mg | RECTAL | Status: DC | PRN

## 2019-09-04 MED ORDER — SODIUM CHLORIDE 0.9 % IV SOLN: INTRAVENOUS | Status: DC

## 2019-09-04 MED ORDER — ACETAMINOPHEN 325 MG PO TABS: 650.0000 mg | ORAL_TABLET | ORAL | Status: DC | PRN

## 2019-09-04 MED ORDER — CHLORHEXIDINE GLUCONATE CLOTH 2 % EX PADS
6.0000 | MEDICATED_PAD | Freq: Every day | CUTANEOUS | Status: DC
  Administered 2019-09-04 – 2019-09-06 (×3): 6 via TOPICAL

## 2019-09-04 MED ORDER — POTASSIUM CHLORIDE CRYS ER 20 MEQ PO TBCR
40.0000 meq | EXTENDED_RELEASE_TABLET | Freq: Once | ORAL | Status: AC
  Administered 2019-09-04: 40 meq via ORAL
  Filled 2019-09-04: qty 2

## 2019-09-04 NOTE — Evaluation (Signed)
Physical Therapy Evaluation Patient Details Name: Jeanne [name] MRN: [id number] DOB: [birth year] Today's Date: 09/04/2019   History of Present Illness  54 yr old woman with hx of HTN, noted with sudden onset L facial, arm, leg weakness, dysarthria, at 1300, brought by EMS to ER, and NIH 15 for L sided sensory, visual , motor, ataxia, and speech deficits. CT head unremarkable. Pt received tPA. CTA demonstrates R MCA occlusion. 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  Clinical Impression  Pt presents to PT with deficits in functional mobility, gait, balance, power, and endurance. Per pt she does have gait deviations at baseline and is not far from her functional baseline currently. Pt performs all mobility during this session without physical assistance requirements. Pt will benefit from continued acute PT services to continue progressing upon activity tolerance and to provide further gait and balance challenges. PT currently recommends discharge home with outpatient PT and intermittent supervision from spouse.    Follow Up Recommendations Outpatient PT;Supervision - Intermittent    Equipment Recommendations  None recommended by PT    Recommendations for Other Services       Precautions / Restrictions Precautions Precautions: Fall Restrictions Weight Bearing Restrictions: No      Mobility  Bed Mobility Overal bed mobility: Modified Independent             General bed mobility comments: HOB elevated, use of rails  Transfers Overall transfer level: Needs assistance Equipment used: None Transfers: Sit to/from Stand Sit to Stand: Supervision            Ambulation/Gait Ambulation/Gait assistance: Supervision Gait Distance (Feet): 100 Feet Assistive device: None Gait Pattern/deviations: Step-to pattern;Decreased stance time - right;Wide base of  support Gait velocity: reduced Gait velocity interpretation: <1.8 ft/sec, indicate of risk for recurrent falls General Gait Details: pt with short staggering gait with widened BOS and reduced stance time on LLE, pt reports this is close to her baseline  Careers information officer    Modified Rankin (Stroke Patients Only) Modified Rankin (Stroke Patients Only) Pre-Morbid Rankin Score: No significant disability Modified Rankin: Moderately severe disability     Balance Overall balance assessment: Needs assistance Sitting-balance support: No upper extremity supported;Feet supported Sitting balance-Leahy Scale: Good Sitting balance - Comments: supervision without UE support   Standing balance support: No upper extremity supported;During functional activity Standing balance-Leahy Scale: Good Standing balance comment: supervision to wash hands at sink                             Pertinent Vitals/Pain Pain Assessment: Faces Faces Pain Scale: Hurts little more Pain Location: back Pain Descriptors / Indicators: Grimacing Pain Intervention(s): Monitored during session    Home Living Family/patient expects to be discharged to:: Private residence Living Arrangements: Spouse/significant other Available Help at Discharge: Family;Available 24 hours/day Type of Home: House Home Access: Ramped entrance     Home Layout: One level Home Equipment: Cane - single point      Prior Function Level of Independence: Independent         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   Dominant Hand: Right    Extremity/Trunk Assessment   Upper Extremity Assessment Upper Extremity Assessment: RUE deficits/detail RUE Sensation:  (tingling R finger tips)  Lower Extremity Assessment Lower Extremity Assessment: Overall WFL for tasks assessed    Cervical / Trunk Assessment Cervical / Trunk Assessment: Normal  Communication    Communication:  (intermittent dysarthria noted)  Cognition Arousal/Alertness: Awake/alert Behavior During Therapy: WFL for tasks assessed/performed Overall Cognitive Status: Within Functional Limits for tasks assessed                                        General Comments General comments (skin integrity, edema, etc.): VSS on RA    Exercises     Assessment/Plan    PT Assessment Patient needs continued PT services  PT Problem List Decreased activity tolerance;Decreased balance;Decreased mobility;Decreased safety awareness;Decreased knowledge of precautions;Impaired sensation       PT Treatment Interventions DME instruction;Gait training;Stair training;Functional mobility training;Therapeutic activities;Therapeutic exercise;Balance training;Neuromuscular re-education;Cognitive remediation;Patient/family education    PT Goals (Current goals can be found in the Care Plan section)  Acute Rehab PT Goals Patient Stated Goal: To return to prior level of function PT Goal Formulation: With patient Time For Goal Achievement: 09/18/19 Potential to Achieve Goals: Good Additional Goals Additional Goal #1: Pt will maintain dynamic standing balance within 10 inches of his base of support independently.    Frequency Min 4X/week   Barriers to discharge        Co-evaluation               AM-PAC PT "6 Clicks" Mobility  Outcome Measure Help needed turning from your back to your side while in a flat bed without using bedrails?: None Help needed moving from lying on your back to sitting on the side of a flat bed without using bedrails?: None Help needed moving to and from a bed to a chair (including a wheelchair)?: None Help needed standing up from a chair using your arms (e.g., wheelchair or bedside chair)?: None Help needed to walk in hospital room?: None Help needed climbing 3-5 steps with a railing? : A Lot 6 Click Score: 22    End of Session   Activity  Tolerance: Patient tolerated treatment well Patient left: in chair;with call bell/phone within reach;with chair alarm set Nurse Communication: Mobility status PT Visit Diagnosis: Other abnormalities of gait and mobility (R26.89)    Time: 0354-6568 PT Time Calculation (min) (ACUTE ONLY): 26 min   Charges:   PT Evaluation $PT Eval Moderate Complexity: 1 Mod PT Treatments $Gait Training: 8-22 mins        Arlyss Gandy, PT, DPT Acute Rehabilitation Pager: [phone]   Arlyss Gandy 09/04/2019, 10:29 AM

## 2019-09-04 NOTE — Progress Notes (Signed)
  Echocardiogram 2D Echocardiogram has been performed.  Pieter Partridge 09/04/2019, 2:56 PM

## 2019-09-04 NOTE — Evaluation (Signed)
Speech Language Pathology Evaluation Patient Details Name: Jeanne [name] MRN: [id number] DOB: 06/13/1965 Today's Date: 09/04/2019 Time: 1030-1047 SLP Time Calculation (min) (ACUTE ONLY): 17 min  Problem List:  Patient Active Problem List   Diagnosis Date Noted  . 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 Left-sided low back pain without sciatica 05/12/2014  . Viral URI with cough 05/12/2014  . HTN (hypertension) 07/09/2013  . Hyperthyroidism 07/09/2013   Past Medical History:  Past Medical History:  Diagnosis Date  . Allergy   . History of chicken pox   . Hypertension   . Hyperthyroidism    Past Surgical History:  Past Surgical History:  Procedure Laterality Date  . ABDOMINAL HYSTERECTOMY  1994   [place]   HPI:  54 yr old woman with hx of HTN, noted with sudden onset L facial, arm, leg weakness, dysarthria, at 1300 on 09/03/19, brought by EMS to ER, and NIH 15 for L sided sensory, visual , motor, ataxia, and speech deficits. CT head unremarkable. Pt received tPA. CTA demonstrates R MCA occlusion. Transferred to [facility]; Pt underwent cerebral angiogram revealing no large vessel occlusion, occlusion of angular branch of inferior division of R MCA, and outpouching in the R COM region; passed Yale swallow screen on 09/04/19; SLE ordered.  Assessment / Plan / Recommendation Clinical Impression  Pt presents with 100% intelligible speech within complex conversation; auditory comprehension/verbal expression within functional limits; cognition WFL as MOCA (Montreal Cognitive Assessment) revealed a score of 28/30 (pt did not have reading glasses available for written portion)l; OME normal and pt passed Yale swallow screening with no c/o diysphagia; ST will s/o at this time; thank you for this referral.    SLP Assessment  SLP Recommendation/Assessment: Patient does not need any further Speech Language Pathology Services SLP Visit Diagnosis:  Other (comment)    Follow Up Recommendations  None    Frequency and Duration   Evaluation only        SLP Evaluation Cognition  Overall Cognitive Status: Within Functional Limits for tasks assessed Arousal/Alertness: Awake/alert Orientation Level: Oriented X4 Memory: Appears intact Awareness: Appears intact Problem Solving: Appears intact Safety/Judgment: Appears intact       Comprehension  Auditory Comprehension Overall Auditory Comprehension: Appears within functional limits for tasks assessed Yes/No Questions: Within Functional Limits Commands: Within Functional Limits Conversation: Complex Visual Recognition/Discrimination Discrimination: Within Function Limits Reading Comprehension Reading Status: Within funtional limits    Expression Expression Primary Mode of Expression: Verbal Verbal Expression Overall Verbal Expression: Appears within functional limits for tasks assessed Initiation: No impairment Repetition: No impairment Naming: No impairment Pragmatics: No impairment Non-Verbal Means of Communication: Not applicable Written Expression Dominant Hand: Left Written Expression: Within Functional Limits   Oral / Motor  Oral Motor/Sensory Function Overall Oral Motor/Sensory Function: Within functional limits Motor Speech Overall Motor Speech: Appears within functional limits for tasks assessed Respiration: Within functional limits Phonation: Normal Resonance: Within functional limits Articulation: Within functional limitis Intelligibility: Intelligible Motor Planning: Witnin functional limits Motor Speech Errors: Not applicable                       Tressie Stalker, M.S., CCC-SLP 09/04/2019, 11:11 AM

## 2019-09-04 NOTE — Progress Notes (Signed)
I reviewed MRI Brain.  There is an acute right parietal ischemic infarct with a punctate area of hemorrhage.  There is also a couple of areas of right frontal punctate hemorrhages, which may simply be related to IV tPA or angio procedure or combination.  As a result, I will hold off on antiplatelet initiation until tomorrow.    Weston Settle, MS, MD

## 2019-09-04 NOTE — Progress Notes (Signed)
STROKE TEAM PROGRESS NOTE   HISTORY OF PRESENT ILLNESS (per record)  Jeanne [name] is a 54 y.o. female who presents in transfer from the [facility] for possible thrombectomy post-tPA. She was seen by Teleneurology at [facility].  Per Teleneurology note: "54 yr old woman with hx of HTN, noted with sudden onset L facial, arm, leg weakness, dysarthria, at 1300, brought by EMS to ER, and NIH 15 for L sided sensory, visual , motor, ataxia, and speech deficits. CT head unremarkable. Diff Dx; R MCA CVA, other. She is a good candidate for alteplase, the risks, benefits, contraindications reviewed and d.w patient, and sister at bedside. She and sister demonstrate understanding and consent. Benefit given clinical presentation, disabling symptoms outweigh risk of hemorrhage with alteplase. D/w pt, sister, ER. Alteplase bolus administered, no complications, infusion ongoing" CTA head and neck with CTP was obtained: 1. Suboptimal arterial opacification making intracranial vessel analysis difficult. 2. High-grade stenosis right MCA bifurcation. Occlusion of the superior division right MCA M2 branch. 3. Abnormal CT perfusion with no core infarct but 143 mL of delayed perfusion in the right MCA territory. 4. No significant carotid or vertebral artery stenosis in the neck. 5. Severe cervical spine spondylosis causing significant multilevel spinal and foraminal stenosis. She was transferred emergently to [facility] for possible thrombectomy. On arrival, NIHSS was 6. She was drowsy to somnolent, suggestive of possible hemorrhage following tPA. STAT repeat CT head was obtained in the VIR suite, revealing no hemorrhage. She is not on an antiplatelet agent or an anticoagulant at home.  LSN: [id number]  tPA Given: Yes   INTERVAL HISTORY Arterial line is malfunctioning.  She has been off Cleviprex and BP has remained good.  She is sitting in chair.  Feels like her left side numbness and weakness is almost back to normal.       OBJECTIVE Vitals:   09/04/19 0330 09/04/19 0400 09/04/19 0500 09/04/19 0600  BP: (!) 150/81 (!) 142/84 138/72 (!) 143/80  Pulse: 86 86 92 93  Resp: (!) 33 (!) 32 (!) 22 16  Temp:  99 F (37.2 C)    TempSrc:  Oral    SpO2: 95% 94% 96% 97%  Weight:      Height:        CBC:  Recent Labs  Lab 09/03/19 1340 09/03/19 1345  WBC 8.2  --   NEUTROABS 5.6  --   HGB 12.1 13.6  HCT 39.6 40.0  MCV 66.0*  --   PLT 202  --     Basic Metabolic Panel:  Recent Labs  Lab 09/03/19 1340 09/03/19 1345  NA 139 140  K 3.7 4.5  CL 106 104  CO2 25  --   GLUCOSE 98 95  BUN 14 15  CREATININE 0.48 0.50  CALCIUM 8.6*  --     Lipid Panel: No results found for: CHOL, TRIG, HDL, CHOLHDL, VLDL, LDLCALC HgbA1c: No results found for: HGBA1C Urine Drug Screen: No results found for: LABOPIA, COCAINSCRNUR, LABBENZ, AMPHETMU, THCU, LABBARB  Alcohol Level     Component Value Date/Time   ETH <10 09/03/2019 1340    IMAGING  CT Angio Head W or Wo Contrast CT Angio Neck W and/or Wo Contrast CT CEREBRAL PERFUSION W CONTRAST 09/03/2019 IMPRESSION:  1. Suboptimal arterial opacification making intracranial vessel analysis difficult.  2. High-grade stenosis right MCA bifurcation. Occlusion of the superior division right MCA M2 branch.  3. Abnormal CT perfusion with no core infarct but 143 mL of delayed perfusion in the  right MCA territory.  4. No significant carotid or vertebral artery stenosis in the neck.  5. Severe cervical spine spondylosis causing significant multilevel spinal and foraminal stenosis.  6. Marked enlargement right lobe of the thyroid with heterogeneous enhancement. Thyroid ultrasound recommended. (Ref: J Am Coll Radiol. 2015 Feb;12(2): 143-50).   CT HEAD CODE STROKE WO CONTRAST 09/03/2019 IMPRESSION:  1. Normal head CT  2. ASPECTS is 10   MRI Brain WO Contrast - pending  Neuro Interventional Radiology - Cerebral Angiogram - Dr Corliss Skains 09/03/2019 at 4:45 PM S/P RT and  Lt common carotid ,and RT vertebral arteriograms. RT CFA approach. Findings . 1.No LVO. 2 Occlusion of ? Angular branch  of  Inferior division of RT  MCA in the M4 region. 3.Approx 2.8 mm x 2.4 mm  Outpouching  In the right COM region? Aneurysm.  Transthoracic Echocardiogram  00/00/2021 Pending  ECG - ST rate 108 BPM. (See cardiology reading for complete details)   PHYSICAL EXAM Blood pressure (!) 143/80, pulse 93, temperature 99 F (37.2 C), temperature source Oral, resp. rate 16, height 5\' 11"  (1.803 m), weight 125.2 kg, SpO2 97 %.  Awake, alert. Fully oriented. Language-fluent. Comprehension, naming, repetition- intact. Very mild and subtle decreased nasolabial fold on the left.  Smile symmetrical. Tongue midline. PERL.  EOMI. Strength 5/5 BUE and BLE except for mild left pronator drift. Coord- intact FTN bilateral. Sensory - intact bilateral. Gait- walked with RN earlier without major issues.      ASSESSMENT/PLAN [name] is a 54 y.o. female with history of hx of HTN and hyperthyroidism (tapazole) who developed sudden onset L facial, arm, leg weakness, and dysarthria, presented to [facility] ED where she received IV tPA (09/03/2019 at 1100) per Teleneurology and was then transferred to [facility] for possible thrombectomy. Cerebral angiogram but no intervention.  Stroke: right MCA infarct - embolic - source unknown - MRI pending  Resultant  Left side weakness and numbness  Code Stroke CT Head - Normal head CT. ASPECTS is 10   CT head - not ordered  MRI head - pending  MRA head - not ordered  CTA H&N - High-grade stenosis right MCA bifurcation. Occlusion of the superior division right MCA M2 branch. No significant carotid or vertebral artery stenosis in the neck.   CT Perfusion - Abnormal CT perfusion with no core infarct but 143 mL of delayed perfusion in the right MCA territory.   Carotid Doppler - CTA neck performed - carotid dopplers not indicated.  2D Echo -  pending  [facility] Virus 2 - negative  LDL - pending  HgbA1c - pending  UDS - pending  VTE prophylaxis - SCDs Diet  Diet Order            Diet NPO time specified  Diet effective now                 No antithrombotic prior to admission, now on No antithrombotic  Patient will be counseled to be compliant with her antithrombotic medications  Ongoing aggressive stroke risk factor management  Therapy recommendations:  pending  Disposition:  Pending  Hypertension  Home BP meds: none   Current BP meds: Cleviprex  Stable . Permissive hypertension (OK if < 220/120) but gradually normalize in 5-7 days  . Long-term BP goal normotensive  Hyperlipidemia  Home Lipid lowering medication: none   LDL pending, goal < 70  Current lipid lowering medication: none  (add statin when LDL results are known)  Continue statin  at discharge   Other Stroke Risk Factors  ETOH use, advised to drink no more than 1 alcoholic beverage per day.  Obesity, Body mass index is 38.51 kg/m., recommend weight loss, diet and exercise as appropriate   Other Active Problems  Code status - Full code   Hyperthyroidism (tapazole) - CTA - Marked enlargement right lobe of the thyroid with heterogeneous enhancement. Thyroid ultrasound recommended.   CTA - Severe cervical spine spondylosis causing significant multilevel spinal and foraminal stenosis.   Approx 2.8 mm x 2.4 mm  Outpouching  In the right COM region? Aneurysm by cerebral angiogram.  Will need to start antiplatelet therapy if follow up CT at 24 hours is negative for hemorrhagic conversion.  Bmet and CBC today   Hospital day # 1  Assessment/Plan:  Right MCA M2 occlusion with left sided weakness and numbness s/p IV tPA which seems to have recanalized the vessel before IR angio.  Clinically she has improved significantly from onset.  Awaiting MRI Brain to see extent of infarct if any and rule out hemorrhage from tPA.  If negative,  will initiate ASA 81 mg po qd.  Her LDL is at goal without statin.  Her TG are good.  She is not diabetic, but awaiting A1c result.  She is non-smoker.  Telemetry is NSR so far, but will continue surveillance.  She will need TTE and possibly TEE for further stroke work up.    Weston Settle, MS, MD  To contact Stroke Continuity provider, please refer to [email or web address]. After hours, contact General Neurology

## 2019-09-05 ENCOUNTER — Inpatient Hospital Stay (HOSPITAL_COMMUNITY): Payer: Self-pay

## 2019-09-05 MED ORDER — AMLODIPINE BESYLATE 5 MG PO TABS
5.0000 mg | ORAL_TABLET | Freq: Every day | ORAL | Status: DC
  Administered 2019-09-05 – 2019-09-06 (×2): 5 mg via ORAL
  Filled 2019-09-05 (×2): qty 1

## 2019-09-05 MED ORDER — ASPIRIN EC 81 MG PO TBEC
81.0000 mg | DELAYED_RELEASE_TABLET | Freq: Every day | ORAL | Status: DC
  Administered 2019-09-05 – 2019-09-08 (×4): 81 mg via ORAL
  Filled 2019-09-05 (×4): qty 1

## 2019-09-05 MED ORDER — ATORVASTATIN CALCIUM 40 MG PO TABS: 40.0000 mg | ORAL_TABLET | Freq: Every day | ORAL | Status: DC

## 2019-09-05 NOTE — Progress Notes (Signed)
1315 call time 1326 beeper time 1326 exam started 1328 exam finished 1328 images sent to [facility] 1330 Exam completed in Epic 1330 [facility] radiology called

## 2019-09-05 NOTE — Evaluation (Signed)
Occupational Therapy Evaluation Patient Details Name: Jeanne [name] MRN: [id number] DOB: Mar 05, 1965 Today's Date: 09/05/2019    History of Present Illness 54 yr old woman with hx of HTN, noted with sudden onset L facial, arm, leg weakness, dysarthria, at 1300, brought by EMS to ER, and NIH 15 for L sided sensory, visual , motor, ataxia, and speech deficits. CT head unremarkable. Pt received tPA. CTA demonstrates R MCA occlusion. 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   Clinical Impression   PTA pt living with wife independently and working for city [employer]. At time of eval, pt able to complete sit <> stands with supervision assist and complete functional mobility a household distance without DME. Noted some higher level balance deficits during functional mobility, began educating pt on home safety and fall prevention. No unilateral deficits noted in BUEs or BLEs. Pt reports her LUE was weaker previous date, but since has improved. Anticipate pt will progress well with no OT follow up. Will continue to follow while acute to progress BADLs.    Follow Up Recommendations  No OT follow up;Supervision - Intermittent    Equipment Recommendations  Tub/shower seat    Recommendations for Other Services       Precautions / Restrictions Precautions Precautions: Fall Restrictions Weight Bearing Restrictions: No      Mobility Bed Mobility               General bed mobility comments: up in chair  Transfers Overall transfer level: Needs assistance Equipment used: None Transfers: Sit to/from Stand Sit to Stand: Supervision              Balance Overall balance assessment: Needs assistance Sitting-balance support: No upper extremity supported;Feet supported Sitting balance-Leahy Scale: Good Sitting balance - Comments: supervision without UE support    Standing balance support: No upper extremity supported;During functional activity Standing balance-Leahy Scale: Good                             ADL either performed or assessed with clinical judgement   ADL Overall ADL's : Needs assistance/impaired Eating/Feeding: Independent;Sitting   Grooming: Supervision/safety;Sitting;Standing   Upper Body Bathing: Set up;Sitting   Lower Body Bathing: Minimal assistance;Sit to/from stand   Upper Body Dressing : Set up;Sitting   Lower Body Dressing: Minimal assistance;Sit to/from stand;Sitting/lateral leans   Toilet Transfer: Solicitor;Ambulation       Tub/ Shower Transfer: Min guard;Ambulation;Shower seat   Functional mobility during ADLs: Min guard;Cueing for safety       Vision Baseline Vision/History: Wears glasses Wears Glasses: At all times Patient Visual Report: No change from baseline Vision Assessment?: No apparent visual deficits     Perception     Praxis      Pertinent Vitals/Pain Pain Assessment: No/denies pain     Hand Dominance Left   Extremity/Trunk Assessment Upper Extremity Assessment Upper Extremity Assessment: RUE deficits/detail;LUE deficits/detail RUE Deficits / Details: WFL: no unilateral deficits noted; coordination WFL LUE Deficits / Details: coordination WFL comparable to RUE; no strength or other unilateral deficits noted   Lower Extremity Assessment Lower Extremity Assessment: Overall WFL for tasks assessed;Defer to PT evaluation       Communication Communication Communication: No difficulties   Cognition Arousal/Alertness: Awake/alert Behavior During Therapy: WFL for tasks assessed/performed Overall Cognitive Status: Within Functional Limits for tasks assessed  General Comments       Exercises     Shoulder Instructions      Home Living Family/patient expects to be discharged to:: Private  residence Living Arrangements: Spouse/significant other Available Help at Discharge: Family;Available 24 hours/day Type of Home: House Home Access: Ramped entrance     Home Layout: One level     Bathroom Shower/Tub: Chief Strategy Officer: Standard     Home Equipment: Cane - single point          Prior Functioning/Environment Level of Independence: Independent        Comments: works for [employer]        OT Problem List: Decreased knowledge of use of DME or AE;Decreased knowledge of precautions;Decreased activity tolerance;Impaired balance (sitting and/or standing);Decreased safety awareness      OT Treatment/Interventions: Self-care/ADL training;Therapeutic exercise;Patient/family education;Balance training;Therapeutic activities;DME and/or AE instruction    OT Goals(Current goals can be found in the care plan section) Acute Rehab OT Goals Patient Stated Goal: To return to prior level of function OT Goal Formulation: With patient Time For Goal Achievement: 09/19/19 Potential to Achieve Goals: Good  OT Frequency:     Barriers to D/C:            Co-evaluation              AM-PAC OT "6 Clicks" Daily Activity     Outcome Measure Help from another person eating meals?: None Help from another person taking care of personal grooming?: None Help from another person toileting, which includes using toliet, bedpan, or urinal?: A Little Help from another person bathing (including washing, rinsing, drying)?: A Little Help from another person to put on and taking off regular upper body clothing?: None Help from another person to put on and taking off regular lower body clothing?: A Little 6 Click Score: 21   End of Session Nurse Communication: Mobility status  Activity Tolerance: Patient tolerated treatment well Patient left: in chair;with call bell/phone within reach  OT Visit Diagnosis: Other abnormalities of gait and mobility  (R26.89)                Time: 1455-1510 OT Time Calculation (min): 15 min Charges:  OT General Charges $OT Visit: 1 Visit OT Evaluation $OT Eval Moderate Complexity: 1 Mod  Dalphine Handing, MSOT, OTR/L Acute Rehabilitation Services [facility] Office Number: [phone] Pager: [phone]  Dalphine Handing 09/05/2019, 4:22 PM

## 2019-09-05 NOTE — Progress Notes (Signed)
STROKE TEAM PROGRESS NOTE   HISTORY OF PRESENT ILLNESS (per record)  Jeanne [name] is a 54 y.o. female who presents in transfer from the [facility] ED for possible thrombectomy post-tPA. She was seen by Teleneurology at [facility].  Per Teleneurology note: "54 yr old woman with hx of HTN, noted with sudden onset L facial, arm, leg weakness, dysarthria, at 1300, brought by EMS to ER, and NIH 15 for L sided sensory, visual , motor, ataxia, and speech deficits. CT head unremarkable. Diff Dx; R MCA CVA, other. She is a good candidate for alteplase, the risks, benefits, contraindications reviewed and d.w patient, and sister at bedside. She and sister demonstrate understanding and consent. Benefit given clinical presentation, disabling symptoms outweigh risk of hemorrhage with alteplase. D/w pt, sister, ER. Alteplase bolus administered, no complications, infusion ongoing" CTA head and neck with CTP was obtained: 1. Suboptimal arterial opacification making intracranial vessel analysis difficult. 2. High-grade stenosis right MCA bifurcation. Occlusion of the superior division right MCA M2 branch. 3. Abnormal CT perfusion with no core infarct but 143 mL of delayed perfusion in the right MCA territory. 4. No significant carotid or vertebral artery stenosis in the neck. 5. Severe cervical spine spondylosis causing significant multilevel spinal and foraminal stenosis. She was transferred emergently to [facility] for possible thrombectomy. On arrival, NIHSS was 6. She was drowsy to somnolent, suggestive of possible hemorrhage following tPA. STAT repeat CT head was obtained in the VIR suite, revealing no hemorrhage. She is not on an antiplatelet agent or an anticoagulant at home.  LSN: 1300  tPA Given: Yes   INTERVAL HISTORY  BP has slowly increased off Cleviprex.  She is sitting in chair.  She denies any left side numbness and weakness.  MRI showed acute right parietal infarct.  There are some areas of petechial hemorrhage in the  infarct and also in right frontal area.  TTE shows EF 25-30%.  She has never been aware of any heart issues other than a murmur in the past.        OBJECTIVE Vitals:   09/05/19 0500 09/05/19 0600 09/05/19 0700 09/05/19 0800  BP: (!) 148/102 (!) 166/123 (!) 144/87 (!) 151/87  Pulse: 86 (!) 48 86 85  Resp: (!) 28 (!) 26 (!) 23 (!) 37  Temp:      TempSrc:      SpO2: 99% 97% 99% 99%  Weight:      Height:        CBC:  Recent Labs  Lab 09/03/19 1340 09/03/19 1340 09/03/19 1345 09/04/19 1003  WBC 8.2  --   --  10.2  NEUTROABS 5.6  --   --   --   HGB 12.1   < > 13.6 11.4*  HCT 39.6   < > 40.0 36.7  MCV 66.0*  --   --  64.5*  PLT 202  --   --  184   < > = values in this interval not displayed.    Basic Metabolic Panel:  Recent Labs  Lab 09/03/19 1340 09/03/19 1340 09/03/19 1345 09/04/19 1003  NA 139   < > 140 138  K 3.7   < > 4.5 3.3*  CL 106   < > 104 108  CO2 25  --   --  21*  GLUCOSE 98   < > 95 170*  BUN 14   < > 15 10  CREATININE 0.48   < > 0.50 0.60  CALCIUM 8.6*  --   --  8.7*   < > = values in this interval not displayed.    Lipid Panel:     Component Value Date/Time   CHOL 100 09/04/2019 1003   TRIG 31 09/04/2019 1003   HDL 43 09/04/2019 1003   CHOLHDL 2.3 09/04/2019 1003   VLDL 6 09/04/2019 1003   LDLCALC 51 09/04/2019 1003   HgbA1c: No results found for: HGBA1C Urine Drug Screen: No results found for: LABOPIA, COCAINSCRNUR, LABBENZ, AMPHETMU, THCU, LABBARB  Alcohol Level     Component Value Date/Time   ETH <10 09/03/2019 1340    IMAGING  CT Angio Head W or Wo Contrast CT Angio Neck W and/or Wo Contrast CT CEREBRAL PERFUSION W CONTRAST 09/03/2019 IMPRESSION:  1. Suboptimal arterial opacification making intracranial vessel analysis difficult.  2. High-grade stenosis right MCA bifurcation. Occlusion of the superior division right MCA M2 branch.  3. Abnormal CT perfusion with no core infarct but 143 mL of delayed perfusion in the right MCA  territory.  4. No significant carotid or vertebral artery stenosis in the neck.  5. Severe cervical spine spondylosis causing significant multilevel spinal and foraminal stenosis.  6. Marked enlargement right lobe of the thyroid with heterogeneous enhancement. Thyroid ultrasound recommended. (Ref: J Am Coll Radiol. 2015 Feb;12(2): 143-50).   CT HEAD CODE STROKE WO CONTRAST 09/03/2019 IMPRESSION:  1. Normal head CT  2. ASPECTS is 10   MRI Brain WO Contrast 09/04/2019 IMPRESSION: 1. Positive for a 3 cm core infarct in the Right parietal lobe. Cytotoxic edema with punctate hemosiderin and no significant mass effect. 2. Positive also for petechial hemorrhage with mild edema in the Right inferior frontal gyrus. No associated mass effect. 3. Moderate for age nonspecific cerebral white matter signal changes.  Neuro Interventional Radiology - Cerebral Angiogram - Dr Corliss Skains 09/03/2019 at 4:45 PM S/P RT and Lt common carotid ,and RT vertebral arteriograms. RT CFA approach. Findings . 1.No LVO. 2 Occlusion of ? Angular branch  of  Inferior division of RT  MCA in the M4 region. 3. Approx 2.8 mm x 2.4 mm  Outpouching  In the right COM region? Aneurysm.  Transthoracic Echocardiogram  09/05/2019 IMPRESSIONS  1. Technically difficult; LV function difficult to quantitate.  2. Left ventricular ejection fraction, by estimation, is 25 to 30%. The  left ventricle has severely decreased function. The left ventricle  demonstrates global hypokinesis. The left ventricular internal cavity size  was severely dilated. There is mild  left ventricular hypertrophy. Left ventricular diastolic parameters are  consistent with Grade III diastolic dysfunction (restrictive). Elevated  left atrial pressure.  3. Right ventricular systolic function is normal. The right ventricular  size is normal. Tricuspid regurgitation signal is inadequate for assessing  PA pressure.  4. Left atrial size was mildly dilated.  5.  The mitral valve is normal in structure. Trivial mitral valve  regurgitation. No evidence of mitral stenosis.  6. The aortic valve is tricuspid. Aortic valve regurgitation is trivial.  No aortic stenosis is present.  7. The inferior vena cava is dilated in size with >50% respiratory  variability, suggesting right atrial pressure of 8 mmHg.   ECG - ST rate 108 BPM. (See cardiology reading for complete details)   PHYSICAL EXAM Blood pressure (!) 151/87, pulse 85, temperature 98.7 F (37.1 C), temperature source Axillary, resp. rate (!) 37, height 5\' 11"  (1.803 m), weight 125.2 kg, SpO2 99 %.  Awake, alert. Fully oriented. Language-fluent. Comprehension, naming, repetition- intact.   Smile symmetrical. Tongue midline. PERL.  EOMI.  Strength 5/5 BUE and BLE. Coord- intact FTN bilateral. Sensory - intact bilateral.      ASSESSMENT/PLAN Jeanne [name] is a 54 y.o. female with history of hx of HTN and hyperthyroidism (tapazole) who developed sudden onset L facial, arm, leg weakness, and dysarthria, [facility] she received IV tPA (09/03/2019 at 1100) per Teleneurology and [facility]. Cerebral angiogram but no intervention.  Stroke: right MCA infarct - embolic - source unknown - MRI pending  Resultant  Left side weakness and numbness   Code Stroke CT Head - Normal head CT. ASPECTS is 10   CT head - not ordered  MRI head -  Positive for a 3 cm core infarct in the Right parietal lobe. Cytotoxic edema with punctate hemosiderin and no significant mass effect. Positive also for petechial hemorrhage with mild edema in the Right inferior frontal gyrus. Anti platelet  medication held yesterday based on MRI results.  MRA head - not ordered  CTA H&N - High-grade stenosis right MCA bifurcation. Occlusion of the superior division right MCA M2 branch. No significant carotid or vertebral artery stenosis in the neck.   CT Perfusion -  Abnormal CT perfusion with no core infarct but 143 mL of delayed perfusion in the right MCA territory.   Carotid Doppler - CTA neck performed - carotid dopplers not indicated.  2D Echo - EF 25 to 30%. No cardiac source of emboli identified.   Sars Corona Virus 2 - negative  LDL - 51  HgbA1c - pending  UDS - pending  VTE prophylaxis - SCDs Diet  Diet Order            Diet Heart Room service appropriate? Yes with Assist; Fluid consistency: Thin  Diet effective now                 No antithrombotic prior to admission, now on No antithrombotic  Patient will be counseled to be compliant with her antithrombotic medications  Ongoing aggressive stroke risk factor management  Therapy recommendations:  OP PT recommended  Disposition:  Pending  Hypertension  Home BP meds: none   Current BP meds: Cleviprex  Stable  Permissive hypertension (OK if < 220/120) but gradually normalize in 5-7 days   Long-term BP goal normotensive  Hyperlipidemia  Home Lipid lowering medication: none   LDL 51, goal < 70  Current lipid lowering medication: none - pt at goal  Continue statin at discharge   Other Stroke Risk Factors  ETOH use, advised to drink no more than 1 alcoholic beverage per day.  Obesity, Body mass index is 38.51 kg/m., recommend weight loss, diet and exercise as appropriate   Other Active Problems  Code status - Full code   Hyperthyroidism (tapazole) - CTA - Marked enlargement right lobe of the thyroid with heterogeneous enhancement. Thyroid ultrasound recommended.   CTA - Severe cervical spine spondylosis causing significant multilevel spinal and foraminal stenosis.   Approx 2.8 mm x 2.4 mm  Outpouching  In the right COM region? Aneurysm by cerebral angiogram.  Will need to start antiplatelet therapy if follow up CT at 24 hours is negative for hemorrhagic conversion. Anti platelet  medication held yesterday based on MRI results.  Bmet and CBC -  pending  EF 25 to 30%. Possible embolic source. May need to consider TEE (no TEEs Monday due to holiday - consider scheduling for Tuesday) Consider cardiology consult. Low EF may be due to hyperthyroidism. Thyroid studies?  Hypokalemia - 3.3 on 7/3 - supplemented - repeat pending today   Approx 2.8 mm x 2.4 mm  Outpouching  In the right COM region? Aneurysm.   Hospital day # 2  Assessment/Plan:  Right parietal ischemic infarct without any clinical residual at this time.  There were some small areas of petechial hemorrhage possibly related to IV tPA.  I will repeat CT Brain today.  If no discernible blood, I will initiate ASA 81 mg po qd.  Her LDL is at goal without statin.  Her TG are good.  She is not diabetic, but awaiting A1c result.  She is non-smoker.  Telemetry is NSR so far, but will continue surveillance.  She has low EF, possibly due to coronary disease and possibly non-ischemic cardiomyopathy.  I will do a TEE to assess the heart in more detail.  She will likely require coronary angiography as outpatient down the line.  BP is not well controlled.  I will initiate CCB,    Weston Settle, MS, MD  To contact Stroke Continuity provider, please refer to [email or web address]. After hours, contact General Neurology

## 2019-09-06 DIAGNOSIS — I5022 Chronic systolic (congestive) heart failure: Secondary | ICD-10-CM

## 2019-09-06 DIAGNOSIS — I6601 Occlusion and stenosis of right middle cerebral artery: Secondary | ICD-10-CM

## 2019-09-06 LAB — CBC
HCT: 37.2 % (ref 36.0–46.0)
Hemoglobin: 11.3 g/dL — ABNORMAL LOW (ref 12.0–15.0)
MCH: 19.7 pg — ABNORMAL LOW (ref 26.0–34.0)
MCHC: 30.4 g/dL (ref 30.0–36.0)
MCV: 64.9 fL — ABNORMAL LOW (ref 80.0–100.0)
Platelets: 173 10*3/uL (ref 150–400)
RBC: 5.73 MIL/uL — ABNORMAL HIGH (ref 3.87–5.11)
RDW: 22 % — ABNORMAL HIGH (ref 11.5–15.5)
WBC: 7.9 10*3/uL (ref 4.0–10.5)
nRBC: 0 % (ref 0.0–0.2)

## 2019-09-06 LAB — BASIC METABOLIC PANEL
Anion gap: 9 (ref 5–15)
BUN: 9 mg/dL (ref 6–20)
CO2: 23 mmol/L (ref 22–32)
Calcium: 8.5 mg/dL — ABNORMAL LOW (ref 8.9–10.3)
Chloride: 108 mmol/L (ref 98–111)
Creatinine, Ser: 0.56 mg/dL (ref 0.44–1.00)
GFR calc Af Amer: >60 mL/min (ref >60)
GFR calc non Af Amer: >60 mL/min (ref >60)
Glucose, Bld: 89 mg/dL (ref 70–99)
Potassium: 3.7 mmol/L (ref 3.5–5.1)
Sodium: 140 mmol/L (ref 135–145)

## 2019-09-06 MED ORDER — CARVEDILOL 3.125 MG PO TABS
3.1250 mg | ORAL_TABLET | Freq: Two times a day (BID) | ORAL | Status: DC
  Administered 2019-09-06 – 2019-09-08 (×5): 3.125 mg via ORAL
  Filled 2019-09-06 (×5): qty 1

## 2019-09-06 NOTE — Progress Notes (Signed)
Physical Therapy Treatment Patient Details Name: Jeanne [name] MRN: [id number] DOB: [birth year] Today's Date: 09/06/2019    History of Present Illness 54 yr old woman with hx of HTN, noted with sudden onset L facial, arm, leg weakness, dysarthria, at 1300, brought by EMS to ER, and NIH 15 for L sided sensory, visual , motor, ataxia, and speech deficits. CT head unremarkable. Pt received tPA. CTA demonstrates R MCA occlusion. 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    PT Comments    Pt received in bed, agreeable to participation in therapy. She required supervision transfers and ambulation 225' without AD. Tachy with mobility, max HR 156. Quick decrease to 112  HR after return to bed. Current POC remains appropriate.   Follow Up Recommendations  Outpatient PT;Supervision - Intermittent     Equipment Recommendations  None recommended by PT    Recommendations for Other Services       Precautions / Restrictions Precautions Precautions: Fall Restrictions Weight Bearing Restrictions: No    Mobility  Bed Mobility Overal bed mobility: Modified Independent             General bed mobility comments: Increased time from supine<>sit EOB  Transfers Overall transfer level: Needs assistance Equipment used: None Transfers: Sit to/from Stand Sit to Stand: Supervision         General transfer comment: supervision for safety   Ambulation/Gait Ambulation/Gait assistance: Supervision Gait Distance (Feet): 225 Feet Assistive device: None Gait Pattern/deviations: Step-through pattern;Decreased stride length;Wide base of support;Trunk flexed Gait velocity: decreased Gait velocity interpretation: <1.31 ft/sec, indicative of household ambulator General Gait Details: cues for posture and to look up. Resting HR 99-112. Max HR during amb 156. No LOB  noted.   Stairs             Wheelchair Mobility    Modified Rankin (Stroke Patients Only) Modified Rankin (Stroke Patients Only) Pre-Morbid Rankin Score: No significant disability Modified Rankin: Moderately severe disability     Balance Overall balance assessment: Needs assistance Sitting-balance support: No upper extremity supported;Feet supported Sitting balance-Leahy Scale: Good Sitting balance - Comments: supervision without UE support Postural control: Other (comment) (anterior lean) Standing balance support: No upper extremity supported;During functional activity Standing balance-Leahy Scale: Good Standing balance comment: supervision amb, no LOB                            Cognition Arousal/Alertness: Awake/alert Behavior During Therapy: WFL for tasks assessed/performed Overall Cognitive Status: Within Functional Limits for tasks assessed                                 General Comments: Pt with good safety awareness and general awareness with procedures to be done later in the day      Exercises      General Comments General comments (skin integrity, edema, etc.): Pt ambulated to gym to perform tub transfer. Verbal cues for visual scanning and looking for objects in path      Pertinent Vitals/Pain Pain Assessment: No/denies pain    Home Living                      Prior Function            PT Goals (current goals can now be  found in the care plan section) Acute Rehab PT Goals Patient Stated Goal: home Progress towards PT goals: Progressing toward goals    Frequency    Min 4X/week      PT Plan Current plan remains appropriate    Co-evaluation              AM-PAC PT "6 Clicks" Mobility   Outcome Measure  Help needed turning from your back to your side while in a flat bed without using bedrails?: None Help needed moving from lying on your back to sitting on the side of a flat bed without using  bedrails?: None Help needed moving to and from a bed to a chair (including a wheelchair)?: None Help needed standing up from a chair using your arms (e.g., wheelchair or bedside chair)?: None Help needed to walk in hospital room?: None Help needed climbing 3-5 steps with a railing? : A Lot 6 Click Score: 22    End of Session Equipment Utilized During Treatment: Gait belt Activity Tolerance: Patient tolerated treatment well Patient left: in bed;with call bell/phone within reach;with bed alarm set;with family/visitor present Nurse Communication: Mobility status PT Visit Diagnosis: Other abnormalities of gait and mobility (R26.89)     Time: 6979-4801 PT Time Calculation (min) (ACUTE ONLY): 12 min  Charges:  $Gait Training: 8-22 mins                     Aida Raider, PT  Office # [phone] Pager [phone]    Ilda Foil 09/06/2019, 12:24 PM

## 2019-09-06 NOTE — TOC Initial Note (Signed)
Transition of Care [facility]) - Initial/Assessment Note    Patient Details  Name: Keri Veale MRN: [id number] Date of Birth: [birth year]  Transition of Care [facility]) CM/SW Contact:    Kermit Balo, RN Phone Number: 09/06/2019, 11:16 AM  Clinical Narrative:                 Pt lives in [place] and prefers a PCP in the [place] area. She has been going to a clinic in [place]. CM provided a couple (no insurance) clinics on her AVS. They will serve as her PCP and assist with medication cost.  Pt states she has a tub seat at home.  She drives or her significant other can provide needed transportation. Pt prefers outpatient therapy in the [place] area. CM will place order in Epic and information on the AVS. TOC following for medication assistance at d/c.   Expected Discharge Plan: OP Rehab Barriers to Discharge: Inadequate or no insurance, Continued Medical Work up   Patient Goals and CMS Choice     Choice offered to / list presented to : Patient  Expected Discharge Plan and Services Expected Discharge Plan: OP Rehab   Discharge Planning Services: CM Consult   Living arrangements for the past 2 months: Single Family Home                                      Prior Living Arrangements/Services Living arrangements for the past 2 months: Single Family Home Lives with:: Significant Other Patient language and need for interpreter reviewed:: Yes Do you feel safe going back to the place where you live?: Yes      Need for Family Participation in Patient Care: Yes (Comment) Care giver support system in place?: Yes (comment)   Criminal Activity/Legal Involvement Pertinent to Current Situation/Hospitalization: No - Comment as needed  Activities of Daily Living      Permission Sought/Granted                  Emotional Assessment Appearance:: Appears stated age Attitude/Demeanor/Rapport: Engaged Affect (typically observed): Accepting Orientation: : Oriented  to Self, Oriented to Place, Oriented to  Time, Oriented to Situation   Psych Involvement: No (comment)  Admission diagnosis:  Acute ischemic stroke (HCC) [I63.9] Dysarthria [R47.1] Left-sided weakness [R53.1] Stroke (cerebrum) (HCC) [I63.9] Middle cerebral artery embolism, 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] Patient Active Problem List   Diagnosis Date Noted   Stroke (cerebrum) (HCC) 09/03/2019   Middle cerebral artery embolism, 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   Left-sided low back pain without sciatica 05/12/2014   Viral URI with cough 05/12/2014   HTN (hypertension) 07/09/2013   Hyperthyroidism 07/09/2013   PCP:  Patient, No Pcp Per Pharmacy:   [id number] [place], [place] - [address] [place] [address] Phone: [phone] Fax: [phone]  [facility] [address] [place] - [address] [place] [address] [place] [address] [place] [address] Phone: [phone] Fax: [phone]     Social Determinants of Health (SDOH) Interventions    Readmission Risk Interventions No flowsheet data found.

## 2019-09-06 NOTE — Progress Notes (Addendum)
Occupational Therapy Treatment Patient Details Name: Jeanne [name] MRN: [id number] DOB: 03/05/1965 Today's Date: 09/06/2019    History of present illness 54 yr old woman with hx of HTN, noted with sudden onset L facial, arm, leg weakness, dysarthria, at 1300, brought by EMS to ER, and [facility] 15 for L sided sensory, visual , motor, ataxia, and speech deficits. CT head unremarkable. Pt received tPA. CTA demonstrates R MCA occlusion. 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   OT comments  Upon arrival pt sitting up in bed - agreeable and excited for skilled-OT session. Pt requiring supervision with transfers and min guard for ambulation to rehab gym to complete tub transfer. Pt completed tub transfer with supervision and correct/safe technique and confirmed she has extended tub bench at home. Verbal cues required with ambulation to assist in avoiding obstacles on R side. Pt confirms she has noticed a deficit in vision on the R side but it has improved. Educated client on visual compensatory techniques while ambulating and gave education packet about falls prevention in the home. Pt able to read and identify techniques to use in her home. Believe pt is progressing towards OT goals and dc plan remains appropriate. Will follow acutely.    Follow Up Recommendations  No OT follow up;Supervision - Intermittent    Equipment Recommendations  None recommended by OT       Precautions / Restrictions Precautions Precautions: Fall Restrictions Weight Bearing Restrictions: No       Mobility Bed Mobility Overal bed mobility: Modified Independent             General bed mobility comments: Increased time from supine<>sit EOB  Transfers Overall transfer level: Needs assistance Equipment used: None Transfers: Sit to/from Stand Sit to Stand: Supervision         General  transfer comment: supervision for safety     Balance Overall balance assessment: Needs assistance Sitting-balance support: No upper extremity supported;Feet supported Sitting balance-Leahy Scale: Good Sitting balance - Comments: supervision without UE support Postural control: Other (comment) (anterior lean) Standing balance support: No upper extremity supported;During functional activity Standing balance-Leahy Scale: Good                             ADL either performed or assessed with clinical judgement   ADL Overall ADL's : Needs assistance/impaired                                 Tub/ Shower Transfer: Supervision/safety;Ambulation;Tub bench Tub/Shower Transfer Details (indicate cue type and reason): supervision for safety. Pt reports she has shower bench at home Functional mobility during ADLs: Min guard;Cueing for safety General ADL Comments: Supervision for tub transfer with tub bench. Min guard for ambulation with anterior lean and cues for visual scanning.     Vision   Vision Assessment?: Vision impaired- to be further tested in functional context Additional Comments: noted leaning to R and needing cues to scan and look to the R when ambulating with obstacles. Pt able to read fall prevention handouts           Cognition Arousal/Alertness: Awake/alert Behavior During Therapy: [facility] for tasks assessed/performed Overall Cognitive Status: Within Functional Limits for tasks assessed  General Comments: Pt with good safety awareness and general awareness with procedures to be done later in the day              General Comments Pt ambulated to gym to perform tub transfer. Verbal cues for visual scanning and looking for objects in path    Pertinent Vitals/ Pain       Pain Assessment: No/denies pain         Frequency  Min 2X/week        Progress Toward Goals  OT Goals(current goals can now  be found in the care plan section)  Progress towards OT goals: Progressing toward goals  Acute Rehab OT Goals Patient Stated Goal: To return to prior level of function OT Goal Formulation: With patient Time For Goal Achievement: 09/19/19 Potential to Achieve Goals: Good  Plan Discharge plan remains appropriate       AM-PAC OT "6 Clicks" Daily Activity     Outcome Measure   Help from another person eating meals?: None Help from another person taking care of personal grooming?: None Help from another person toileting, which includes using toliet, bedpan, or urinal?: A Little Help from another person bathing (including washing, rinsing, drying)?: A Little Help from another person to put on and taking off regular upper body clothing?: None Help from another person to put on and taking off regular lower body clothing?: A Little 6 Click Score: 21    End of Session Equipment Utilized During Treatment: Gait belt  OT Visit Diagnosis: Other abnormalities of gait and mobility (R26.89);Low vision, both eyes (H54.2)   Activity Tolerance Patient tolerated treatment well   Patient Left in bed;with call bell/phone within reach;with bed alarm set   Nurse Communication Mobility status        Time: 4268-3419 OT Time Calculation (min): 13 min  Charges: OT General Charges $OT Visit: 1 Visit OT Treatments $Self Care/Home Management : 8-22 mins  Armonii Sieh/OTS   Darby Fleeman 09/06/2019, 9:31 AM

## 2019-09-06 NOTE — Plan of Care (Signed)
progressing 

## 2019-09-06 NOTE — Consult Note (Addendum)
Cardiology Consultation:   Patient ID: Jeanne [name] MRN: [id number]; DOB: 08-20-1965  Admit date: 09/03/2019 Date of Consult: 09/06/2019  Primary Care Provider: Patient, No Pcp Per [facility] Cardiologist: New  [facility] Electrophysiologist:  None    Patient Profile:   Jeanne [name] is a 54 y.o. female with a hx of HTN admitted with CVA who is being seen today for the evaluation of systolic dysfunction at the request of Dr Pearlean Brownie.  History of Present Illness:   Jeanne [name] is a 54 yo female history of HTN admitted with acute left sided weakness and CVA. As part of workup echo showed LVEF 25-30%, a new finding for the patient. Cardiology consulted new diagnosis of sysotlic dysfunction. She reports some intermittent LE edema a times, some recnet DOE with short distances.   K 3.7 Cr 0.56 BUN 9 WBC 7.9 Hgb 11.3 Plt 173  Echo LVEF 25-30%, global hypokinesis, grade III DDx. Normal RV     Past Medical History:  Diagnosis Date  . Allergy   . History of chicken pox   . Hypertension   . Hyperthyroidism     Past Surgical History:  Procedure Laterality Date  . ABDOMINAL HYSTERECTOMY  1994   [facility]     Inpatient Medications: Scheduled Meds: .  stroke: mapping our early stages of recovery book   Does not apply Once  . amLODipine  5 mg Oral Daily  . aspirin EC  81 mg Oral Daily   Continuous Infusions: . sodium chloride 75 mL/hr at 09/05/19 1723   PRN Meds: acetaminophen **OR** acetaminophen (TYLENOL) oral liquid 160 mg/5 mL **OR** acetaminophen, albuterol, iohexol, senna-docusate  Allergies:   No Known Allergies  Social History:   Social History   Socioeconomic History  . Marital status: Single    Spouse name: Not on file  . Number of children: Not on file  . Years of education: Not on file  . Highest education level: Not on file  Occupational History  . Not on file  Tobacco Use  . Smoking status: Never Smoker  . Smokeless tobacco: Never Used    Substance and Sexual Activity  . Alcohol use: Yes    Comment: socially  . Drug use: No  . Sexual activity: Not on file  Other Topics Concern  . Not on file  Social History Narrative  . Not on file   Social Determinants of Health   Financial Resource Strain:   . Difficulty of Paying Living Expenses:   Food Insecurity:   . Worried About Programme researcher, broadcasting/film/video in the Last Year:   . Barista in the Last Year:   Transportation Needs:   . Freight forwarder (Medical):   Marland Kitchen Lack of Transportation (Non-Medical):   Physical Activity:   . Days of Exercise per Week:   . Minutes of Exercise per Session:   Stress:   . Feeling of Stress :   Social Connections:   . Frequency of Communication with Friends and Family:   . Frequency of Social Gatherings with Friends and Family:   . Attends Religious Services:   . Active Member of Clubs or Organizations:   . Attends Banker Meetings:   Marland Kitchen Marital Status:   Intimate Partner Violence:   . Fear of Current or Ex-Partner:   . Emotionally Abused:   Marland Kitchen Physically Abused:   . Sexually Abused:     Family History:    Family History  Problem Relation Age of Onset  .  Hypertension Maternal Grandmother   . Diabetes Maternal Grandmother   . Dementia Mother      ROS:  Please see the history of present illness.  All other ROS reviewed and negative.     Physical Exam/Data:   Vitals:   09/06/19 0413 09/06/19 0811 09/06/19 1150 09/06/19 1539  BP: 135/75 140/70 134/89 131/84  Pulse: 82 89 (!) 52 87  Resp:  Temp: 98.1 F (36.7 C) 98.2 F (36.8 C) 98.8 F (37.1 C) 98.1 F (36.7 C)  TempSrc: Oral Oral Oral Oral  SpO2: 98% 99% 100% 98%  Weight:      Height:        Intake/Output Summary (Last 24 hours) at 09/06/2019 1619 Last data filed at 09/06/2019 1300 Gross per 24 hour  Intake 560 ml  Output --  Net 560 ml   Last 3 Weights 09/03/2019 03/18/2018 05/11/2014  Weight (lbs) 276 lb 1.6 oz 278 lb 277 lb 6.4 oz   Weight (kg) 125.238 kg 126.1 kg 125.828 kg     Body mass index is 38.51 kg/m.  General:  Well nourished, well developed, in no acute distress HEENT: normal Lymph: no adenopathy Neck: no JVD Endocrine:  No thryomegaly Vascular: No carotid bruits; FA pulses 2+ bilaterally without bruits  Cardiac:  normal S1, S2; RRR; no murmur  Lungs:  clear to auscultation bilaterally, no wheezing, rhonchi or rales  Abd: soft, nontender, no hepatomegaly  Ext: no edema Musculoskeletal:  No deformities, BUE and BLE strength normal and equal Skin: warm and dry  Neuro:  CNs 2-12 intact, no focal abnormalities noted Psych:  Normal affect     Laboratory Data:  High Sensitivity Troponin:  No results for input(s): TROPONINIHS in the last 720 hours.   Chemistry Recent Labs  Lab 09/03/19 1340 09/03/19 1340 09/03/19 1345 09/04/19 1003 09/06/19 0642  NA 139   < > 140 138 140  K 3.7   < > 4.5 3.3* 3.7  CL 106   < > 104 108 108  CO2 25  --   --  21* 23  GLUCOSE 98   < > 95 170* 89  BUN 14   < > CREATININE 0.48   < > 0.50 0.60 0.56  CALCIUM 8.6*  --   --  8.7* 8.5*  GFRNONAA >60  --   --  >60 >60  GFRAA >60  --   --  >60 >60  ANIONGAP 8  --   --  9 9   < > = values in this interval not displayed.    Recent Labs  Lab 09/03/19 1340  PROT 6.9  ALBUMIN 3.5  AST 17  ALT 15  ALKPHOS 106  BILITOT 0.6   Hematology Recent Labs  Lab 09/03/19 1340 09/03/19 1340 09/03/19 1345 09/04/19 1003 09/06/19 0642  WBC 8.2  --   --  10.2 7.9  RBC 6.00*  --   --  5.69* 5.73*  HGB 12.1   < > 13.6 11.4* 11.3*  HCT 39.6   < > 40.0 36.7 37.2  MCV 66.0*  --   --  64.5* 64.9*  MCH 20.2*  --   --  20.0* 19.7*  MCHC 30.6  --   --  31.1 30.4  RDW 22.7*  --   --  21.9* 22.0*  PLT 202  --   --  184 173   < > = values in this interval not displayed.   BNPNo  results for input(s): BNP, PROBNP in the last 168 hours.  DDimer No results for input(s): DDIMER in the last 168 hours.   Radiology/Studies:   CT Angio Head W or Wo Contrast  Result Date: 09/03/2019 CLINICAL DATA:  Left facial droop. Speech abnormality. Left-sided weakness. Rule out stroke. EXAM: CT ANGIOGRAPHY HEAD AND NECK CT PERFUSION BRAIN TECHNIQUE: Multidetector CT imaging of the head and neck was performed using the standard protocol during bolus administration of intravenous contrast. Multiplanar CT image reconstructions and MIPs were obtained to evaluate the vascular anatomy. Carotid stenosis measurements (when applicable) are obtained utilizing NASCET criteria, using the distal internal carotid diameter as the denominator. Multiphase CT imaging of the brain was performed following IV bolus contrast injection. Subsequent parametric perfusion maps were calculated using RAPID software. CONTRAST:  OMNIPAQUE IOHEXOL 350 MG/ML SOLN COMPARISON:  CT head 09/03/2019 FINDINGS: CTA NECK FINDINGS Aortic arch: Normal aortic arch. Bovine branching pattern. Proximal great vessels widely patent. Right carotid system: Normal right carotid. Negative for stenosis or atherosclerotic disease. Left carotid system: Normal left carotid. Negative for stenosis or atherosclerotic disease. Vertebral arteries: Normal vertebral arteries bilaterally. Skeleton: Scoliosis. Multilevel disc degeneration and spurring. There is significant spinal stenosis at C3-4, C4-5, and C5-6 with expected cord compression. There is significant foraminal encroachment due to spurring bilaterally at C4-5, C5-6, C6-7, C7-T1. No acute skeletal abnormality. Other neck: Overall the right lobe of the thyroid measures 4.4 x 3.9 x 8.0 cm. This is displacing the trachea to the left. Small left lobe of the thyroid. Upper chest: Lung apices clear bilaterally. Review of the MIP images confirms the above findings CTA HEAD FINDINGS Anterior circulation: Suboptimal arterial opacification intracranially due to timing of the injection. Internal carotid arteries patent bilaterally. There is irregularity  which may be related to poor opacification versus atherosclerotic disease. No significant calcification in the cavernous carotid. Anterior cerebral arteries patent bilaterally. Left middle cerebral artery patent without significant stenosis or large vessel occlusion. Vascular irregularity could be due to atherosclerotic disease or artifact. Left M1 segment is patent. There is a high-grade stenosis at the right MCA bifurcation with distal flow. This is a short segment stenosis. There is occlusion of the superior division of the right middle cerebral artery in the M2 segment. Posterior circulation: Both vertebral arteries patent to the basilar. PICA patent bilaterally. Basilar patent. Superior cerebellar and posterior cerebral arteries patent bilaterally without stenosis. Venous sinuses: Normal venous enhancement Anatomic variants: None Review of the MIP images confirms the above findings CT Brain Perfusion Findings: ASPECTS: 10 CBF (<30%) Volume: 65mL Perfusion (Tmax>6.0s) volume: Mismatch Volume: Infarction Location:Right MCA territory involving the frontal parietal lobe. There also are patchy areas of artifact in the left hemisphere. IMPRESSION: 1. Suboptimal arterial opacification making intracranial vessel analysis difficult. 2. High-grade stenosis right MCA bifurcation. Occlusion of the superior division right MCA M2 Azlan Hanway. 3. Abnormal CT perfusion with no core infarct but 143 mL of delayed perfusion in the right MCA territory. 4. No significant carotid or vertebral artery stenosis in the neck. 5. Severe cervical spine spondylosis causing significant multilevel spinal and foraminal stenosis. 6. Marked enlargement right lobe of the thyroid with heterogeneous enhancement. Thyroid ultrasound recommended. (Ref: J Am Coll Radiol. 2015 Feb;12(2): 143-50). 7. These results were called by telephone at the time of interpretation on 09/03/2019 at 2:15 pm to provider JOSHUA LONG , who verbally acknowledged these  results. Electronically Signed   By: Marlan Palau M.D.   On: 09/03/2019 14:18   CT  HEAD WO CONTRAST  Result Date: 09/05/2019 CLINICAL DATA:  54 year old female status post code stroke presentation on 09/03/19 with right MCA bifurcation/M2 occlusion status post IV tPA and followup cerebral angiogram but no large vessel occlusion. Follow-up brain MRI yesterday demonstrating petechial hemorrhage, most pronounced in an area of the right inferior frontal gyrus which lacked restricted diffusion. EXAM: CT HEAD WITHOUT CONTRAST TECHNIQUE: Contiguous axial images were obtained from the base of the skull through the vertex without intravenous contrast. COMPARISON:  Brain MRI yesterday, presentation head CT 09/03/2019. FINDINGS: Brain: Mild parenchymal hypodensity - not brand new but progressed since 09/03/2019 - with only subtle hyperintensity at the area of right inferior frontal gyrus confluent petechial hemorrhage yesterday. And infact, some of the SWI signal yesterday and hyperdensity today may be related to a small thrombosed MCA Irisha Grandmaison (series 3, image 14). No regional mass effect. More confluent cytotoxic edema corresponding to the core infarct in the right parietal lobe, new since 09/03/2019 (series 3, image 22). No overt intracranial hemorrhage by CT. No intracranial mass effect. Stable ventricle size and configuration. Stable gray-white matter differentiation elsewhere. Normal basilar cisterns. Vascular: Mild Calcified atherosclerosis at the skull base. No other hyperdense vessel. Skull: Hyperostosis (normal variant). No acute osseous abnormality identified. Sinuses/Orbits: Visualized paranasal sinuses and mastoids are stable and well pneumatized. Other: Visualized orbits and scalp soft tissues are within normal limits. IMPRESSION: 1. Mild cerebral edema in the right inferior frontal gyrus, increased since 09/03/2019 and now favored to be an area of subacute ischemia. Petechial hemorrhage here on MRI is largely  occult by CT, although there is possibly a small hyperdense/thrombosed MCA Caryssa Elzey there. 2. Stable cytotoxic edema in the right parietal lobe core infarct seen on MRI yesterday. 3. No malignant hemorrhagic transformation or intracranial mass effect. 4. No new intracranial abnormality. Electronically Signed   By: Odessa Fleming M.D.   On: 09/05/2019 10:23   CT Angio Neck W and/or Wo Contrast  Result Date: 09/03/2019 CLINICAL DATA:  Left facial droop. Speech abnormality. Left-sided weakness. Rule out stroke. EXAM: CT ANGIOGRAPHY HEAD AND NECK CT PERFUSION BRAIN TECHNIQUE: Multidetector CT imaging of the head and neck was performed using the standard protocol during bolus administration of intravenous contrast. Multiplanar CT image reconstructions and MIPs were obtained to evaluate the vascular anatomy. Carotid stenosis measurements (when applicable) are obtained utilizing NASCET criteria, using the distal internal carotid diameter as the denominator. Multiphase CT imaging of the brain was performed following IV bolus contrast injection. Subsequent parametric perfusion maps were calculated using RAPID software. CONTRAST:  OMNIPAQUE IOHEXOL 350 MG/ML SOLN COMPARISON:  CT head 09/03/2019 FINDINGS: CTA NECK FINDINGS Aortic arch: Normal aortic arch. Bovine branching pattern. Proximal great vessels widely patent. Right carotid system: Normal right carotid. Negative for stenosis or atherosclerotic disease. Left carotid system: Normal left carotid. Negative for stenosis or atherosclerotic disease. Vertebral arteries: Normal vertebral arteries bilaterally. Skeleton: Scoliosis. Multilevel disc degeneration and spurring. There is significant spinal stenosis at C3-4, C4-5, and C5-6 with expected cord compression. There is significant foraminal encroachment due to spurring bilaterally at C4-5, C5-6, C6-7, C7-T1. No acute skeletal abnormality. Other neck: Overall the right lobe of the thyroid measures 4.4 x 3.9 x 8.0 cm. This is  displacing the trachea to the left. Small left lobe of the thyroid. Upper chest: Lung apices clear bilaterally. Review of the MIP images confirms the above findings CTA HEAD FINDINGS Anterior circulation: Suboptimal arterial opacification intracranially due to timing of the injection. Internal carotid arteries patent bilaterally. There  is irregularity which may be related to poor opacification versus atherosclerotic disease. No significant calcification in the cavernous carotid. Anterior cerebral arteries patent bilaterally. Left middle cerebral artery patent without significant stenosis or large vessel occlusion. Vascular irregularity could be due to atherosclerotic disease or artifact. Left M1 segment is patent. There is a high-grade stenosis at the right MCA bifurcation with distal flow. This is a short segment stenosis. There is occlusion of the superior division of the right middle cerebral artery in the M2 segment. Posterior circulation: Both vertebral arteries patent to the basilar. PICA patent bilaterally. Basilar patent. Superior cerebellar and posterior cerebral arteries patent bilaterally without stenosis. Venous sinuses: Normal venous enhancement Anatomic variants: None Review of the MIP images confirms the above findings CT Brain Perfusion Findings: ASPECTS: 10 CBF (<30%) Volume: 32mL Perfusion (Tmax>6.0s) volume: Mismatch Volume: Infarction Location:Right MCA territory involving the frontal parietal lobe. There also are patchy areas of artifact in the left hemisphere. IMPRESSION: 1. Suboptimal arterial opacification making intracranial vessel analysis difficult. 2. High-grade stenosis right MCA bifurcation. Occlusion of the superior division right MCA M2 Jameisha Stofko. 3. Abnormal CT perfusion with no core infarct but 143 mL of delayed perfusion in the right MCA territory. 4. No significant carotid or vertebral artery stenosis in the neck. 5. Severe cervical spine spondylosis causing significant  multilevel spinal and foraminal stenosis. 6. Marked enlargement right lobe of the thyroid with heterogeneous enhancement. Thyroid ultrasound recommended. (Ref: J Am Coll Radiol. 2015 Feb;12(2): 143-50). 7. These results were called by telephone at the time of interpretation on 09/03/2019 at 2:15 pm to provider JOSHUA LONG , who verbally acknowledged these results. Electronically Signed   By: Marlan Palau M.D.   On: 09/03/2019 14:18   MR BRAIN WO CONTRAST  Addendum Date: 09/04/2019   ADDENDUM REPORT: 09/04/2019 15:12 ADDENDUM: Study discussed by telephone with Dr. Nicholas Lose on 09/04/2019 at 1511 hours. Electronically Signed   By: Odessa Fleming M.D.   On: 09/04/2019 15:12   Result Date: 09/04/2019 CLINICAL DATA:  54 year old female status post code stroke presentation yesterday with right MCA bifurcation/M2 occlusion status post IV tPA and cerebral angiogram but no large vessel occlusion at that time. EXAM: MRI HEAD WITHOUT CONTRAST TECHNIQUE: Multiplanar, multiecho pulse sequences of the brain and surrounding structures were obtained without intravenous contrast. COMPARISON:  CTA and CTP yesterday. FINDINGS: Brain: Roughly 3 cm area of gyriform and subcortical restricted diffusion in the right parietal lobe (series 5, image 85). No right frontal lobe restricted diffusion. Faint restricted diffusion also in central white matter near the right parietooccipital junction. However, in addition to the right parietal lobe cytotoxic edema with T2 and FLAIR hyperintensity there is a 2 cm area of similar but less pronounced T2 and FLAIR hyperintensity in the right inferior frontal gyrus (series 11, image 15), although diffusion here is isointense to slightly facilitated. Furthermore, there is evidence of petechial hemorrhage in that right inferior frontal gyrus (series 14, image 31, Heidelberg Classification 1B). There is also a punctate focus of hemosiderin in the affected right parietal lobe (image 32). No intracranial hemorrhage  identified elsewhere. No other restricted diffusion. Widely scattered bilateral cerebral white matter T2 and FLAIR hyperintensity, mostly subcortical. No chronic cortical encephalomalacia identified. Deep gray nuclei, brainstem and cerebellum are within normal limits. No midline shift, mass effect, evidence of mass lesion, ventriculomegaly, extra-axial collection or acute intracranial hemorrhage. Cervicomedullary junction and pituitary are within normal limits. Vascular: Major intracranial vascular flow voids are preserved. Skull and upper cervical spine:  Mild C3-C4 cervical spine degeneration and spinal stenosis on series 9, image 12. Hyperostosis of the calvarium. Visualized bone marrow signal is within normal limits. Sinuses/Orbits: Negative orbits.  Paranasal sinuses are clear. Other: Mastoids are clear. Scalp and face soft tissues appear negative. IMPRESSION: 1. Positive for a 3 cm core infarct in the Right parietal lobe. Cytotoxic edema with punctate hemosiderin and no significant mass effect. 2. Positive also for petechial hemorrhage with mild edema in the Right inferior frontal gyrus. No associated mass effect. 3. Moderate for age nonspecific cerebral white matter signal changes. Electronically Signed: By: Odessa Fleming M.D. On: 09/04/2019 15:07   CT CEREBRAL PERFUSION W CONTRAST  Result Date: 09/03/2019 CLINICAL DATA:  Left facial droop. Speech abnormality. Left-sided weakness. Rule out stroke. EXAM: CT ANGIOGRAPHY HEAD AND NECK CT PERFUSION BRAIN TECHNIQUE: Multidetector CT imaging of the head and neck was performed using the standard protocol during bolus administration of intravenous contrast. Multiplanar CT image reconstructions and MIPs were obtained to evaluate the vascular anatomy. Carotid stenosis measurements (when applicable) are obtained utilizing NASCET criteria, using the distal internal carotid diameter as the denominator. Multiphase CT imaging of the brain was performed following IV bolus  contrast injection. Subsequent parametric perfusion maps were calculated using RAPID software. CONTRAST:  OMNIPAQUE IOHEXOL 350 MG/ML SOLN COMPARISON:  CT head 09/03/2019 FINDINGS: CTA NECK FINDINGS Aortic arch: Normal aortic arch. Bovine branching pattern. Proximal great vessels widely patent. Right carotid system: Normal right carotid. Negative for stenosis or atherosclerotic disease. Left carotid system: Normal left carotid. Negative for stenosis or atherosclerotic disease. Vertebral arteries: Normal vertebral arteries bilaterally. Skeleton: Scoliosis. Multilevel disc degeneration and spurring. There is significant spinal stenosis at C3-4, C4-5, and C5-6 with expected cord compression. There is significant foraminal encroachment due to spurring bilaterally at C4-5, C5-6, C6-7, C7-T1. No acute skeletal abnormality. Other neck: Overall the right lobe of the thyroid measures 4.4 x 3.9 x 8.0 cm. This is displacing the trachea to the left. Small left lobe of the thyroid. Upper chest: Lung apices clear bilaterally. Review of the MIP images confirms the above findings CTA HEAD FINDINGS Anterior circulation: Suboptimal arterial opacification intracranially due to timing of the injection. Internal carotid arteries patent bilaterally. There is irregularity which may be related to poor opacification versus atherosclerotic disease. No significant calcification in the cavernous carotid. Anterior cerebral arteries patent bilaterally. Left middle cerebral artery patent without significant stenosis or large vessel occlusion. Vascular irregularity could be due to atherosclerotic disease or artifact. Left M1 segment is patent. There is a high-grade stenosis at the right MCA bifurcation with distal flow. This is a short segment stenosis. There is occlusion of the superior division of the right middle cerebral artery in the M2 segment. Posterior circulation: Both vertebral arteries patent to the basilar. PICA patent  bilaterally. Basilar patent. Superior cerebellar and posterior cerebral arteries patent bilaterally without stenosis. Venous sinuses: Normal venous enhancement Anatomic variants: None Review of the MIP images confirms the above findings CT Brain Perfusion Findings: ASPECTS: 10 CBF (<30%) Volume: 0mL Perfusion (Tmax>6.0s) volume: Mismatch Volume: Infarction Location:Right MCA territory involving the frontal parietal lobe. There also are patchy areas of artifact in the left hemisphere. IMPRESSION: 1. Suboptimal arterial opacification making intracranial vessel analysis difficult. 2. High-grade stenosis right MCA bifurcation. Occlusion of the superior division right MCA M2 Julus Kelley. 3. Abnormal CT perfusion with no core infarct but 143 mL of delayed perfusion in the right MCA territory. 4. No significant carotid or vertebral artery stenosis in  the neck. 5. Severe cervical spine spondylosis causing significant multilevel spinal and foraminal stenosis. 6. Marked enlargement right lobe of the thyroid with heterogeneous enhancement. Thyroid ultrasound recommended. (Ref: J Am Coll Radiol. 2015 Feb;12(2): 143-50). 7. These results were called by telephone at the time of interpretation on 09/03/2019 at 2:15 pm to provider JOSHUA LONG , who verbally acknowledged these results. Electronically Signed   By: Marlan Palau M.D.   On: 09/03/2019 14:18   ECHOCARDIOGRAM COMPLETE  Result Date: 09/04/2019    ECHOCARDIOGRAM REPORT   Patient Name:   Jeanne [name] Date of Exam: 09/04/2019 Medical Rec #:  [id number]        Height:       71.0 in Accession #:    [id number]       Weight:       276.1 lb Date of Birth:  04-26-1965        BSA:          2.418 m Patient Age:    54 years         BP:           111/91 mmHg Patient Gender: F                HR:           102 bpm. Exam Location:  Inpatient Procedure: 2D Echo, Cardiac Doppler and Color Doppler Indications:    CVA  History:        Patient has no prior history of Echocardiogram  examinations.                 Stroke; Risk Factors:Hypertension.  Sonographer:    Lavenia Atlas Referring Phys: [phone] ERIC LINDZEN  Sonographer Comments: Patient is morbidly obese. IMPRESSIONS  1. Technically difficult; LV function difficult to quantitate.  2. Left ventricular ejection fraction, by estimation, is 25 to 30%. The left ventricle has severely decreased function. The left ventricle demonstrates global hypokinesis. The left ventricular internal cavity size was severely dilated. There is mild left ventricular hypertrophy. Left ventricular diastolic parameters are consistent with Grade III diastolic dysfunction (restrictive). Elevated left atrial pressure.  3. Right ventricular systolic function is normal. The right ventricular size is normal. Tricuspid regurgitation signal is inadequate for assessing PA pressure.  4. Left atrial size was mildly dilated.  5. The mitral valve is normal in structure. Trivial mitral valve regurgitation. No evidence of mitral stenosis.  6. The aortic valve is tricuspid. Aortic valve regurgitation is trivial. No aortic stenosis is present.  7. The inferior vena cava is dilated in size with >50% respiratory variability, suggesting right atrial pressure of 8 mmHg. FINDINGS  Left Ventricle: Left ventricular ejection fraction, by estimation, is 25 to 30%. The left ventricle has severely decreased function. The left ventricle demonstrates global hypokinesis. The left ventricular internal cavity size was severely dilated. There is mild left ventricular hypertrophy. Left ventricular diastolic parameters are consistent with Grade III diastolic dysfunction (restrictive). Elevated left atrial pressure. Right Ventricle: The right ventricular size is normal. Right ventricular systolic function is normal. Tricuspid regurgitation signal is inadequate for assessing PA pressure. The tricuspid regurgitant velocity is 1.68 m/s, and with an assumed right atrial  pressure of 3 mmHg, the  estimated right ventricular systolic pressure is 14.3 mmHg. Left Atrium: Left atrial size was mildly dilated. Right Atrium: Right atrial size was normal in size. Pericardium: Trivial pericardial effusion is present. Mitral Valve: The mitral valve is normal in structure. Normal mobility of the mitral valve  leaflets. Trivial mitral valve regurgitation. No evidence of mitral valve stenosis. Tricuspid Valve: The tricuspid valve is normal in structure. Tricuspid valve regurgitation is trivial. No evidence of tricuspid stenosis. Aortic Valve: The aortic valve is tricuspid. Aortic valve regurgitation is trivial. No aortic stenosis is present. Pulmonic Valve: The pulmonic valve was normal in structure. Pulmonic valve regurgitation is trivial. No evidence of pulmonic stenosis. Aorta: The aortic root is normal in size and structure. Venous: The inferior vena cava is dilated in size with greater than 50% respiratory variability, suggesting right atrial pressure of 8 mmHg.  Additional Comments: Technically difficult; LV function difficult to quantitate.  LEFT VENTRICLE PLAX 2D LVIDd:         6.30 cm  Diastology LVIDs:         4.60 cm  LV e' lateral:   8.16 cm/s LV PW:         1.20 cm  LV E/e' lateral: 14.3 LV IVS:        1.00 cm  LV e' medial:    6.85 cm/s LVOT diam:     2.30 cm  LV E/e' medial:  17.1 LV SV:         57 LV SV Index:   24 LVOT Area:     4.15 cm  RIGHT VENTRICLE RV Basal diam:  3.60 cm RV S prime:     6.42 cm/s TAPSE (M-mode): 2.8 cm LEFT ATRIUM             Index       RIGHT ATRIUM           Index LA diam:        4.30 cm 1.78 cm/m  RA Area:     16.80 cm LA Vol (A2C):   83.0 ml 34.32 ml/m RA Volume:   49.10 ml  20.30 ml/m LA Vol (A4C):   71.1 ml 29.40 ml/m LA Biplane Vol: 79.0 ml 32.67 ml/m  AORTIC VALVE LVOT Vmax:   90.80 cm/s LVOT Vmean:  54.800 cm/s LVOT VTI:    0.138 m  AORTA Ao Root diam: 2.80 cm MITRAL VALVE                TRICUSPID VALVE MV Area (PHT): 19.45 cm    TR Peak grad:   11.3 mmHg MV Decel  Time: 39 msec      TR Vmax:        168.00 cm/s MV E velocity: 117.00 cm/s MV A velocity: 54.20 cm/s   SHUNTS MV E/A ratio:  2.16         Systemic VTI:  0.14 m                             Systemic Diam: 2.30 cm Olga MillersBrian Crenshaw MD Electronically signed by Olga MillersBrian Crenshaw MD Signature Date/Time: 09/04/2019/3:13:34 PM    Final    CT HEAD CODE STROKE WO CONTRAST  Result Date: 09/03/2019 CLINICAL DATA:  Code stroke. Left-sided weakness with paralysis. Acute onset. EXAM: CT HEAD WITHOUT CONTRAST TECHNIQUE: Contiguous axial images were obtained from the base of the skull through the vertex without intravenous contrast. COMPARISON:  None. FINDINGS: Brain: The study suffers from some motion degradation. No sign of brain atrophy, old or acute infarction, mass lesion, hemorrhage, hydrocephalus or extra-axial collection. Patient has a tendency towards dural calcification. Vascular: No abnormal vascular finding. Skull: Negative Sinuses/Orbits: Clear/normal Other: None ASPECTS (Alberta Stroke Program Early CT Score) - Ganglionic level  infarction (caudate, lentiform nuclei, internal capsule, insula, M1-M3 cortex): 7 - Supraganglionic infarction (M4-M6 cortex): 3 Total score (0-10 with 10 being normal): 10 IMPRESSION: 1. Normal head CT 2. ASPECTS is 10 3. These results were called by telephone at the time of interpretation on 09/03/2019 at 1:35 pm to provider JOSHUA LONG , who verbally acknowledged these results. Electronically Signed   By: Paulina Fusi M.D.   On: 09/03/2019 13:36   {  Assessment and Plan:   1. CVA - per primary team - primary team has requested TEE, loop recorder. We will arrange   2. Systolic heart failiure - new diagnosis this admission, LVEF 25-30%. Does not appear volume overloaded this admit - when outside window of permissive HTN would start coreg 3.125mg  bid followed by entresto 24/26mg  bid. Would stop norvasc to make room with bp when ready to start - will need consideration for ischemic  evaluation as outpatient once she recovers from her stroke. Could consider 3-6 months of medical therapy and if not improved consider ischemic eval at that time.    Primary team ok with starting CHF meds, will start coreg 3.125mg  bid tonight. Would start entresto 24/26mg  bid tomorrow.   For questions or updates, please contact [facility] Please consult [email or web address] for contact info under    Signed, Dina Rich, MD  09/06/2019 4:19 PM

## 2019-09-06 NOTE — Progress Notes (Signed)
STROKE TEAM PROGRESS NOTE   INTERVAL HISTORY Patient is sitting up in bed.  Her wife is at the bedside.  She has no complaints.  She does admit to snoring and having difficult to with sleeping.  She has not been evaluated for sleep apnea but appears to be at high risk given her body habitus  OBJECTIVE Vitals:   09/05/19 2352 09/06/19 0408 09/06/19 0413 09/06/19 0811  BP: (!) 147/68 133/89 135/75 140/70  Pulse: 89 (!) 58 82 89  Resp: 18 19  18   Temp: 98.7 F (37.1 C) 99.3 F (37.4 C) 98.1 F (36.7 C) 98.2 F (36.8 C)  TempSrc: Oral Oral Oral Oral  SpO2: 97% 100% 98% 99%  Weight:      Height:       CBC:  Recent Labs  Lab 09/03/19 1340 09/03/19 1345 09/04/19 1003 09/06/19 0642  WBC 8.2   < > 10.2 7.9  NEUTROABS 5.6  --   --   --   HGB 12.1   < > 11.4* 11.3*  HCT 39.6   < > 36.7 37.2  MCV 66.0*   < > 64.5* 64.9*  PLT 202   < > 184 173   < > = values in this interval not displayed.   Basic Metabolic Panel:  Recent Labs  Lab 09/04/19 1003 09/06/19 0642  NA 138 140  K 3.3* 3.7  CL 108 108  CO2 21* 23  GLUCOSE 170* 89  BUN 10 9  CREATININE 0.60 0.56  CALCIUM 8.7* 8.5*    Lipid Panel:     Component Value Date/Time   CHOL 100 09/04/2019 1003   TRIG 31 09/04/2019 1003   HDL 43 09/04/2019 1003   CHOLHDL 2.3 09/04/2019 1003   VLDL 6 09/04/2019 1003   LDLCALC 51 09/04/2019 1003   HgbA1c: No results found for: HGBA1C Urine Drug Screen: No results found for: LABOPIA, COCAINSCRNUR, LABBENZ, AMPHETMU, THCU, LABBARB  Alcohol Level     Component Value Date/Time   ETH <10 09/03/2019 1340    IMAGING past 24h No results found.   PHYSICAL EXAM Obese middle-aged African-American lady not in distress. . Afebrile. Head is nontraumatic. Neck is supple without bruit.    Cardiac exam no murmur or gallop. Lungs are clear to auscultation. Distal pulses are well felt. Neurological Exam : Awake alert oriented to time place and person.  Mild dysarthria.  Extraocular movements  are full range without nystagmus.  Blinks to threat bilaterally.  Moderate left lower facial weakness.  Tongue midline.  Motor system exam symmetric upper and lower extremity strength without focal weakness.  Deep tendon flexes symmetric.  Plantars downgoing.  Sensation intact.  Coordination accurate.  Gait not tested.  NIH stroke scale score 3.  Premorbid baseline modified Rankin scale 0  ASSESSMENT/PLAN [name] is a 54 y.o. female with history of hx of HTN and hyperthyroidism (tapazole) who developed sudden onset L facial, arm, leg weakness, and dysarthria, presented to [facility] ED where she received IV tPA (09/03/2019 at 1100) per Teleneurology and was then transferred to [facility] for possible thrombectomy. Cerebral angiogram but no intervention.  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 ASPECTS is 10   MRI head -  Positive for a 3 cm core infarct in the Right parietal lobe. Cytotoxic edema with punctate hemosiderin and  no significant mass effect. Positive also for petechial hemorrhage with mild edema in the Right inferior frontal gyrus.   CTA H&N - High-grade stenosis right MCA bifurcation. Occlusion of the superior division right MCA M2 branch.    CT Perfusion -  no core infarct but 143 mL of delayed perfusion in the right MCA territory.   Cerebral angio 09/03/2019.  No LVO.  Occlusion of possible angular branch of inferior division of the right MCA and M4 region.  2.8 x 2.4 mm outpouching of the right cavernous carotid possibly aneurysm  2D Echo - EF 25 to 30%. No cardiac source of emboli identified.   TEE pending 09/07/2019  Loop pending at discharge  [employer] Virus 2 - negative  LDL - 51  HgbA1c - pending  UDS - pending  VTE prophylaxis - SCDs  No antithrombotic prior to admission, now on aspirin 81 mg  daily  Therapy recommendations:  OP PT    Disposition:  Pending  Hypertension  Home BP meds: none   Current BP meds: Cleviprex  Stable . Permissive hypertension (OK if < 220/120) but gradually normalize in 5-7 days  . Long-term BP goal normotensive  Hyperlipidemia  Home Lipid lowering medication: none   LDL 51, goal < 70  Current lipid lowering medication: none - pt at goal  Continue statin at discharge   Other Stroke Risk Factors  ETOH use, advised to drink no more than 1 alcoholic beverage per day.  Obesity, Body mass index is 38.51 kg/m., recommend weight loss, diet and exercise as appropriate   Other Active Problems  Code status - Full code   Hyperthyroidism (tapazole) - CTA - Marked enlargement right lobe of the thyroid with heterogeneous enhancement. Thyroid ultrasound recommended.   CTA - Severe cervical spine spondylosis causing significant multilevel spinal and foraminal stenosis.   Approx 2.8 mm x 2.4 mm  Outpouching  In the right COM region? Aneurysm by cerebral angiogram.  Will need to start antiplatelet therapy if follow up CT at 24 hours is negative for hemorrhagic conversion. Anti platelet  medication held yesterday based on MRI results.  Bmet and CBC - pending  EF 25 to 30%. Possible embolic source. May need to consider TEE (  scheduled for Tuesday 09/07/19) Consider cardiology consult. Low EF may be due to hyperthyroidism. Thyroid studies?  Hypokalemia - 3.3 on 7/3 - supplemented - repeat pending today   Approx 2.8 mm x 2.4 mm  Outpouching  In the right COM region? Aneurysm.   Hospital day # 3  Assessment/Plan:  Right parietal ischemic infarct without any clinical residual at this time.  There were some small areas of petechial hemorrhage possibly related to IV tPA.  I will repeat CT Brain today.  If no discernible blood, I will initiate ASA 81 mg po qd.  Her LDL is at goal without statin.  Her TG are good.  She is not diabetic, but awaiting  A1c result.  She is non-smoker.  Telemetry is NSR so far, but will continue surveillance.  She has low EF, possibly due to coronary disease and possibly non-ischemic cardiomyopathy.  I will do a TEE to assess the heart in more detail.  Cardiology consult is pending she will likely require further cardiac w/u  as outpatient down the line.  She appears to be at risk for sleep apnea and may benefit with consideration for possible participation in the sleep smart stroke study.  She was given information to review and decide.  Discussed  with patient and her wife and answered questions.  Greater than 50% time during this 25-minute visit was spent in counseling and coordination of care about her embolic stroke and suspected sleep apnea and answering questions Delia Heady, MD To contact Stroke Continuity provider, please refer to [email or web address]. After hours, contact General Neurology

## 2019-09-07 ENCOUNTER — Encounter (HOSPITAL_COMMUNITY): Payer: Self-pay | Admitting: Radiology

## 2019-09-07 ENCOUNTER — Encounter (HOSPITAL_COMMUNITY)
Admission: EM | Disposition: A | Discharge status: Home / Self Care | Payer: Self-pay | Source: Home / Self Care | Attending: Neurology

## 2019-09-07 DIAGNOSIS — I5043 Acute on chronic combined systolic (congestive) and diastolic (congestive) heart failure: Secondary | ICD-10-CM

## 2019-09-07 LAB — BASIC METABOLIC PANEL
Anion gap: 8 (ref 5–15)
BUN: 7 mg/dL (ref 6–20)
CO2: 25 mmol/L (ref 22–32)
Calcium: 8.6 mg/dL — ABNORMAL LOW (ref 8.9–10.3)
Chloride: 105 mmol/L (ref 98–111)
Creatinine, Ser: 0.52 mg/dL (ref 0.44–1.00)
GFR calc Af Amer: >60 mL/min (ref >60)
GFR calc non Af Amer: >60 mL/min (ref >60)
Glucose, Bld: 94 mg/dL (ref 70–99)
Potassium: 3.5 mmol/L (ref 3.5–5.1)
Sodium: 138 mmol/L (ref 135–145)

## 2019-09-07 LAB — CBC
HCT: 37.7 % (ref 36.0–46.0)
Hemoglobin: 11.6 g/dL — ABNORMAL LOW (ref 12.0–15.0)
MCH: 19.8 pg — ABNORMAL LOW (ref 26.0–34.0)
MCHC: 30.8 g/dL (ref 30.0–36.0)
MCV: 64.4 fL — ABNORMAL LOW (ref 80.0–100.0)
Platelets: 164 10*3/uL (ref 150–400)
RBC: 5.85 MIL/uL — ABNORMAL HIGH (ref 3.87–5.11)
RDW: 21.8 % — ABNORMAL HIGH (ref 11.5–15.5)
WBC: 7.7 10*3/uL (ref 4.0–10.5)
nRBC: 0 % (ref 0.0–0.2)

## 2019-09-07 LAB — HEMOGLOBIN A1C
Hgb A1c MFr Bld: 5.6 % (ref 4.8–5.6)
Mean Plasma Glucose: 114 mg/dL

## 2019-09-07 LAB — TSH: TSH: <0.01 u[IU]/mL — ABNORMAL LOW (ref 0.350–4.500)

## 2019-09-07 SURGERY — LOOP RECORDER INSERTION
Anesthesia: LOCAL

## 2019-09-07 MED ORDER — CLOPIDOGREL BISULFATE 75 MG PO TABS
75.0000 mg | ORAL_TABLET | Freq: Every day | ORAL | Status: DC
  Administered 2019-09-07 – 2019-09-08 (×2): 75 mg via ORAL
  Filled 2019-09-07 (×2): qty 1

## 2019-09-07 MED ORDER — SACUBITRIL-VALSARTAN 24-26 MG PO TABS
1.0000 | ORAL_TABLET | Freq: Two times a day (BID) | ORAL | Status: DC
  Administered 2019-09-07 – 2019-09-08 (×3): 1 via ORAL
  Filled 2019-09-07 (×4): qty 1

## 2019-09-07 NOTE — Progress Notes (Signed)
Physical Therapy Treatment Patient Details Name: Jeanne [name] MRN: [id number] DOB: [birth year] Today's Date: 09/07/2019    History of Present Illness 54 yr old woman with hx of HTN, noted with sudden onset L facial, arm, leg weakness, dysarthria, at 1300, brought by EMS to ER, and [facility] 15 for L sided sensory, visual , motor, ataxia, and speech deficits. CT head unremarkable. Pt received tPA. CTA demonstrates R MCA occlusion. 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    PT Comments    Pt progressing well towards physical therapy goals. Overall pt was pleasant and motivated to participate with PT session. She scored a 20/24 on the DGI indicating pt is generally at a lower risk for falls (a score of <19 indicates a higher risk for falls). Continue to recommend outpatient PT follow-up at d/c however feel she is likely approaching her baseline. Will continue to follow.    Follow Up Recommendations  Outpatient PT;Supervision - Intermittent     Equipment Recommendations  None recommended by PT    Recommendations for Other Services       Precautions / Restrictions Precautions Precautions: Fall Restrictions Weight Bearing Restrictions: No    Mobility  Bed Mobility Overal bed mobility: Modified Independent             General bed mobility comments: Increased time from supine<>sit EOB  Transfers Overall transfer level: Needs assistance Equipment used: None Transfers: Sit to/from Stand Sit to Stand: Supervision         General transfer comment: supervision for safety   Ambulation/Gait Ambulation/Gait assistance: Supervision Gait Distance (Feet): 300 Feet Assistive device: None Gait Pattern/deviations: Step-through pattern;Decreased stride length;Wide base of support;Trunk flexed Gait velocity: decreased Gait velocity interpretation: <1.8 ft/sec,  indicate of risk for recurrent falls General Gait Details: VC's for improved posture and forward gaze throughout   Stairs Stairs: Yes Stairs assistance: Min guard Stair Management: Two rails;Alternating pattern;Forwards Number of Stairs: 3 (Practice stairs - up 3 and down 2) General stair comments: As part of the DGI   Wheelchair Mobility    Modified Rankin (Stroke Patients Only) Modified Rankin (Stroke Patients Only) Pre-Morbid Rankin Score: No significant disability Modified Rankin: Moderately severe disability     Balance Overall balance assessment: Needs assistance Sitting-balance support: No upper extremity supported;Feet supported Sitting balance-Leahy Scale: Good Sitting balance - Comments: supervision without UE support Postural control: Other (comment) (anterior lean) Standing balance support: No upper extremity supported;During functional activity Standing balance-Leahy Scale: Good Standing balance comment: supervision amb, no LOB                 Standardized Balance Assessment Standardized Balance Assessment : Dynamic Gait Index   Dynamic Gait Index Level Surface: Mild Impairment Change in Gait Speed: Normal Gait with Horizontal Head Turns: Normal Gait with Vertical Head Turns: Normal Gait and Pivot Turn: Normal Step Over Obstacle: Mild Impairment Step Around Obstacles: Mild Impairment Steps: Mild Impairment Total Score: 20      Cognition Arousal/Alertness: Awake/alert Behavior During Therapy: WFL for tasks assessed/performed Overall Cognitive Status: Within Functional Limits for tasks assessed                                 General Comments: Pt with good safety awareness and general awareness with procedures to be done later in the day  Exercises      General Comments        Pertinent Vitals/Pain Pain Assessment: No/denies pain Faces Pain Scale: Hurts little more Pain Location: back Pain Descriptors / Indicators:  Grimacing Pain Intervention(s): Limited activity within patient's tolerance;Monitored during session;Repositioned    Home Living                      Prior Function            PT Goals (current goals can now be found in the care plan section) Acute Rehab PT Goals Patient Stated Goal: home PT Goal Formulation: With patient Time For Goal Achievement: 09/18/19 Potential to Achieve Goals: Good Progress towards PT goals: Progressing toward goals    Frequency    Min 4X/week      PT Plan Current plan remains appropriate    Co-evaluation              AM-PAC PT "6 Clicks" Mobility   Outcome Measure  Help needed turning from your back to your side while in a flat bed without using bedrails?: None Help needed moving from lying on your back to sitting on the side of a flat bed without using bedrails?: None Help needed moving to and from a bed to a chair (including a wheelchair)?: None Help needed standing up from a chair using your arms (e.g., wheelchair or bedside chair)?: None Help needed to walk in hospital room?: None Help needed climbing 3-5 steps with a railing? : A Little 6 Click Score: 23    End of Session Equipment Utilized During Treatment: Gait belt Activity Tolerance: Patient tolerated treatment well Patient left: in bed;with call bell/phone within reach;with family/visitor present Nurse Communication: Mobility status PT Visit Diagnosis: Other abnormalities of gait and mobility (R26.89)     Time: 8115-7262 PT Time Calculation (min) (ACUTE ONLY): 15 min  Charges:  $Gait Training: 8-22 mins                     Conni Slipper, PT, DPT Acute Rehabilitation Services Pager: [phone] Office: [phone]    Marylynn Pearson 09/07/2019, 12:26 PM

## 2019-09-07 NOTE — Progress Notes (Signed)
STROKE TEAM PROGRESS NOTE   INTERVAL HISTORY Patient is sitting up in the bed with her wife at the bedside.  She states she is doing well and speech has improved.  She did sign consent and participated in sleep smart study and underwent [place] 3 monitor overnight but tested negative and is a screen failure.  She was seen by cardiologist Dr. Wyline Mood and started on cardiac medications.  Electrophysiology team recommends 30-day heart monitor rather than a loop recorder.  She has no complaints today.  Vital signs stable.  OBJECTIVE Vitals:   09/06/19 1955 09/07/19 0006 09/07/19 0344 09/07/19 0804  BP: 106/84 (!) 133/96 134/85 127/82  Pulse: (!) 53 (!) 108 73 79  Resp: 20 20 20 20   Temp: 98 F (36.7 C) 99.8 F (37.7 C) (!) 97.5 F (36.4 C) 98.5 F (36.9 C)  TempSrc: Oral Oral Oral Oral  SpO2: 99% 100% 99% 100%  Weight:      Height:       CBC:  Recent Labs  Lab 09/03/19 1340 09/03/19 1345 09/06/19 0642 09/07/19 0536  WBC 8.2   < > 7.9 7.7  NEUTROABS 5.6  --   --   --   HGB 12.1   < > 11.3* 11.6*  HCT 39.6   < > 37.2 37.7  MCV 66.0*   < > 64.9* 64.4*  PLT 202   < > 173 164   < > = values in this interval not displayed.   Basic Metabolic Panel:  Recent Labs  Lab 09/06/19 0642 09/07/19 0536  NA 140 138  K 3.7 3.5  CL 108 105  CO2 23 25  GLUCOSE 89 94  BUN 9 7  CREATININE 0.56 0.52  CALCIUM 8.5* 8.6*   Lipid Panel:     Component Value Date/Time   CHOL 100 09/04/2019 1003   TRIG 31 09/04/2019 1003   HDL 43 09/04/2019 1003   CHOLHDL 2.3 09/04/2019 1003   VLDL 6 09/04/2019 1003   LDLCALC 51 09/04/2019 1003   HgbA1c:  Lab Results  Component Value Date   HGBA1C 5.6 09/04/2019   Urine Drug Screen: No results found for: LABOPIA, COCAINSCRNUR, LABBENZ, AMPHETMU, THCU, LABBARB  Alcohol Level     Component Value Date/Time   ETH <10 09/03/2019 1340    IMAGING past 24h No results found.   PHYSICAL EXAM   Obese middle-aged African-American lady not in distress. .  Afebrile. Head is nontraumatic. Neck is supple without bruit.    Cardiac exam no murmur or gallop. Lungs are clear to auscultation. Distal pulses are well felt. Neurological Exam : Awake alert oriented to time place and person.  No dysarthria.  Extraocular movements are full range without nystagmus.  Blinks to threat bilaterally.  Mild left lower facial weakness.  Tongue midline.  Motor system exam symmetric upper and lower extremity strength without focal weakness.  Deep tendon flexes symmetric.  Plantars downgoing.  Sensation intact.  Coordination accurate.  Gait not tested.  NIH stroke scale score 2.  Premorbid baseline modified Rankin scale 0  ASSESSMENT/PLAN Ms. Lajoyce Tamura is a 54 y.o. female with history of hx of HTN and hyperthyroidism (tapazole) who developed sudden onset L facial, arm, leg weakness, and dysarthria, presented to [facility] ED where she received IV tPA (09/03/2019 at 1100) per Teleneurology and was then transferred to [facility] for possible thrombectomy. Cerebral angiogram but no intervention.  Stroke: right MCA infarct - embolic - source likely cardiomyopathy newly  diagnosed  Resultant  Left side weakness and numbness   Code Stroke CT Head - Normal head CT. ASPECTS is 10   MRI head -  Positive for a 3 cm core infarct in the Right parietal lobe. Cytotoxic edema with punctate hemosiderin and no significant mass effect. Positive also for petechial hemorrhage with mild edema in the Right inferior frontal gyrus.   CTA H&N - High-grade stenosis right MCA bifurcation. Occlusion of the superior division right MCA M2 branch.    CT Perfusion -  no core infarct but 143 mL of delayed perfusion in the right MCA territory.   Cerebral angio 09/03/2019.  No LVO.  Occlusion of possible angular branch of inferior division of the right MCA and M4 region.  2.8 x 2.4 mm outpouching of the right cavernous carotid possibly aneurysm  2D Echo - EF 25 to 30%. No  cardiac source of emboli identified.   TEE pending 09/07/2019  Loop pending at discharge  [employer] Virus 2 - negative  LDL - 51  HgbA1c -5.6  UDS -pending  VTE prophylaxis - SCDs  No antithrombotic prior to admission, now on aspirin 81 mg daily  Therapy recommendations:  OP PT    Disposition:  Pending  Hypertension  Home BP meds: none   Current BP meds: Cleviprex  Stable . Permissive hypertension (OK if < 220/120) but gradually normalize in 5-7 days  . Long-term BP goal normotensive  Hyperlipidemia  Home Lipid lowering medication: none   LDL 51, goal < 70  Current lipid lowering medication: none - pt at goal  Continue statin at discharge  Other Stroke Risk Factors  ETOH use, advised to drink no more than 1 alcoholic beverage per day.  Obesity, Body mass index is 38.51 kg/m., recommend weight loss, diet and exercise as appropriate   Other Active Problems  Code status - Full code   Hyperthyroidism (tapazole) - CTA - Marked enlargement right lobe of the thyroid with heterogeneous enhancement. Thyroid ultrasound recommended.   CTA - Severe cervical spine spondylosis causing significant multilevel spinal and foraminal stenosis.   Approx 2.8 mm x 2.4 mm  Outpouching  In the right COM region? Aneurysm by cerebral angiogram.  Will need to start antiplatelet therapy if follow up CT at 24 hours is negative for hemorrhagic conversion. Anti platelet  medication held yesterday based on MRI results.  Bmet and CBC - pending  EF 25 to 30%. Possible embolic source. May need to consider TEE (  scheduled for Tuesday 09/07/19)    Hypokalemia - 3.3 on 7/3 - supplemented - repeat pending today   Approx 2.8 mm x 2.4 mm  Outpouching  In the right COM region? Aneurysm.   Hospital day # 4  Assessment/Plan:  Right parietal ischemic infarct with minimal residual at this time.  Recommend aspirin and Plavix for 3 weeks followed by aspirin alone.  Appreciate cardiology  consult to help manage her cardiomyopathy.  Electrophysiology team recommend 30-day outpatient heart monitor rather than loop recorder.  Patient can be discharged home later today and have outpatient therapies.  Discussed with patient and her wife and answered questions.  Greater than 50% time during this 25-minute visit was spent in counseling and coordination of care about her embolic stroke and   answering questions Delia Heady, MD  To contact Stroke Continuity provider, please refer to [email or web address]. After hours, contact General Neurology

## 2019-09-07 NOTE — Progress Notes (Signed)
   Pt without insurance and new systolic CHF.   Not candidate for loop recorder. Gen cards to see and should arrange 30 day monitor while titrating her GDMT for new CHF.   Discussed with Dr. Johney Frame and Karie Mainland [address]" [place], [place]  09/07/2019 8:11 AM

## 2019-09-07 NOTE — Progress Notes (Addendum)
Progress Note  Patient Name: Jeanne [name] Date of Encounter: 09/07/2019  Primary Cardiologist: New   Subjective   Doing well today No chest pain or SOB. TEE not scheduled until tomorrow   Inpatient Medications    Scheduled Meds: .  stroke: mapping our early stages of recovery book   Does not apply Once  . aspirin EC  81 mg Oral Daily  . carvedilol  3.125 mg Oral BID WC   Continuous Infusions: . sodium chloride 75 mL/hr at 09/05/19 1723   PRN Meds: acetaminophen **OR** acetaminophen (TYLENOL) oral liquid 160 mg/5 mL **OR** acetaminophen, albuterol, iohexol, senna-docusate   Vital Signs    Vitals:   09/06/19 1955 09/07/19 0006 09/07/19 0344 09/07/19 0804  BP: 106/84 (!) 133/96 134/85 127/82  Pulse: (!) 53 (!) 108 73 79  Resp: 20 20 20 20   Temp: 98 F (36.7 C) 99.8 F (37.7 C) (!) 97.5 F (36.4 C) 98.5 F (36.9 C)  TempSrc: Oral Oral Oral Oral  SpO2: 99% 100% 99% 100%  Weight:      Height:        Intake/Output Summary (Last 24 hours) at 09/07/2019 1116 Last data filed at 09/07/2019 0900 Gross per 24 hour  Intake 320 ml  Output --  Net 320 ml   Filed Weights   09/03/19 1349  Weight: 125.2 kg    Physical Exam   General: Well developed, well nourished, NAD Neck: Negative for carotid bruits. No JVD Lungs:Clear to ausculation bilaterally. No wheezes, rales, or rhonchi. Breathing is unlabored. Cardiovascular: RRR with S1 S2. No murmurs Abdomen: Soft, non-tender, non-distended. No obvious abdominal masses. Extremities: No edema. Radial pulses 2+ bilaterally Neuro: Alert and oriented. No focal deficits. No facial asymmetry. MAE spontaneously. Psych: Responds to questions appropriately with normal affect.    Labs    Chemistry Recent Labs  Lab 09/03/19 1340 09/03/19 1345 09/04/19 1003 09/06/19 0642 09/07/19 0536  NA 139   < > 138 140 138  K 3.7   < > 3.3* 3.7 3.5  CL 106   < > 108 108 105  CO2 25   < > 21* 23 25  GLUCOSE 98   < > 170* 89 94   BUN 14   < > 10 9 7   CREATININE 0.48   < > 0.60 0.56 0.52  CALCIUM 8.6*   < > 8.7* 8.5* 8.6*  PROT 6.9  --   --   --   --   ALBUMIN 3.5  --   --   --   --   AST 17  --   --   --   --   ALT 15  --   --   --   --   ALKPHOS 106  --   --   --   --   BILITOT 0.6  --   --   --   --   GFRNONAA >60   < > >60 >60 >60  GFRAA >60   < > >60 >60 >60  ANIONGAP 8   < > 9 9 8    < > = values in this interval not displayed.     Hematology Recent Labs  Lab 09/04/19 1003 09/06/19 0642 09/07/19 0536  WBC 10.2 7.9 7.7  RBC 5.69* 5.73* 5.85*  HGB 11.4* 11.3* 11.6*  HCT 36.7 37.2 37.7  MCV 64.5* 64.9* 64.4*  MCH 20.0* 19.7* 19.8*  MCHC 31.1 30.4 30.8  RDW 21.9* 22.0* 21.8*  PLT  184 173 164    Cardiac EnzymesNo results for input(s): TROPONINI in the last 168 hours. No results for input(s): TROPIPOC in the last 168 hours.   BNPNo results for input(s): BNP, PROBNP in the last 168 hours.   DDimer No results for input(s): DDIMER in the last 168 hours.   Radiology    No results found.  ECG    No new tracing as of 09/07/19- Personally Reviewed  Cardiac Studies   Echocardiogram 09/04/19:   1. Technically difficult; LV function difficult to quantitate.  2. Left ventricular ejection fraction, by estimation, is 25 to 30%. The  left ventricle has severely decreased function. The left ventricle  demonstrates global hypokinesis. The left ventricular internal cavity size  was severely dilated. There is mild  left ventricular hypertrophy. Left ventricular diastolic parameters are  consistent with Grade III diastolic dysfunction (restrictive). Elevated  left atrial pressure.  3. Right ventricular systolic function is normal. The right ventricular  size is normal. Tricuspid regurgitation signal is inadequate for assessing  PA pressure.  4. Left atrial size was mildly dilated.  5. The mitral valve is normal in structure. Trivial mitral valve  regurgitation. No evidence of mitral stenosis.   6. The aortic valve is tricuspid. Aortic valve regurgitation is trivial.  No aortic stenosis is present.  7. The inferior vena cava is dilated in size with >50% respiratory  variability, suggesting right atrial pressure of 8 mmHg.   Patient Profile     54 y.o. female with a hx of HTN admitted with CVA who is being seen today for the evaluation of systolic dysfunction at the request of Dr Pearlean Brownie.  Assessment & Plan    1. CVA: -Presented with right MCA embolic CVA>>felt to be in the setting of cardiomyopathy -Plan per primary team is for further work up with TEE and loop recorder placement however EP states patient is not a candidate for loop implantation with no insurance.  -Plan to proceed with TEE tomorrow  -No deficits at this time -Further management per primary team -Continue ASA  2. New cardiomyopathy/ systolic heart failure: -Per echocardiogram with LVEF at 25-30% -Continue carvedilol and will start Entresto 24/26mg  today  -BP and creatinine stable  -Needs to be considered for OP ischemic workup id no improvement on repeat echo in 3-6 months when stable from CVA -No chest pain, does not appear to be fluid volume overloaded on exam today    Signed, Georgie Chard NP-C [facility] Pager: [phone] 09/07/2019, 11:16 AM     For questions or updates, please contact   Please consult [email or web address] for contact info under Cardiology/STEMI.  Attending Note:   The patient was seen and examined.  Agree with assessment and plan as noted above.  Changes made to the above note as needed.  Patient seen and independently examined with  Georgie Chard, NP .   We discussed all aspects of the encounter. I agree with the assessment and plan as stated above.  1.   Acute on chronic combined CHF:     EF 25-30%.   Starting entresto today .  Pt still eats a fairly salty diet.   Instructed her about salt intake. Cont lasix TEE tomorrow  ive written for heart healthy diet today .  NPO  after midnight tonight. Will continue with ischemia work up as OP   2.  CVA:  Is not a candidate for implantable loop.  Will plan on placing a monitor as OP   3.  Obesity  Advised dietary changes.     I have spent a total of 40 minutes with patient reviewing hospital  notes , telemetry, EKGs, labs and examining patient as well as establishing an assessment and plan that was discussed with the patient. > 50% of time was spent in direct patient care.    Vesta Mixer, Montez Hageman., MD, [facility] 09/07/2019, 11:54 AM [address]  [address] Office [phone] Pager [phone]

## 2019-09-07 NOTE — Progress Notes (Signed)
    [facility] has been requested to perform a transesophageal echocardiogram on [facility] for CVA.  After careful review of history and examination, the risks and benefits of transesophageal echocardiogram have been explained including risks of esophageal damage, perforation (1:10,000 risk), bleeding, pharyngeal hematoma as well as other potential complications associated with conscious sedation including aspiration, arrhythmia, respiratory failure and death. Alternatives to treatment were discussed, questions were answered. Patient is willing to proceed.   Georgie Chard, NP  09/07/2019 3:52 PM

## 2019-09-08 ENCOUNTER — Other Ambulatory Visit: Payer: Self-pay | Admitting: Cardiology

## 2019-09-08 ENCOUNTER — Inpatient Hospital Stay (HOSPITAL_COMMUNITY): Payer: Self-pay

## 2019-09-08 ENCOUNTER — Encounter (HOSPITAL_COMMUNITY): Payer: Self-pay | Admitting: Neurology

## 2019-09-08 ENCOUNTER — Encounter (HOSPITAL_COMMUNITY)
Admission: EM | Disposition: A | Discharge status: Home / Self Care | Payer: Self-pay | Source: Home / Self Care | Attending: Neurology

## 2019-09-08 DIAGNOSIS — I340 Nonrheumatic mitral (valve) insufficiency: Secondary | ICD-10-CM

## 2019-09-08 DIAGNOSIS — I429 Cardiomyopathy, unspecified: Secondary | ICD-10-CM

## 2019-09-08 DIAGNOSIS — I6310 Cerebral infarction due to embolism of unspecified precerebral artery: Secondary | ICD-10-CM

## 2019-09-08 DIAGNOSIS — I10 Essential (primary) hypertension: Secondary | ICD-10-CM

## 2019-09-08 DIAGNOSIS — I639 Cerebral infarction, unspecified: Secondary | ICD-10-CM

## 2019-09-08 HISTORY — PX: BUBBLE STUDY: SHX6837

## 2019-09-08 HISTORY — PX: TEE WITHOUT CARDIOVERSION: SHX5443

## 2019-09-08 LAB — CBC
HCT: 38.9 % (ref 36.0–46.0)
Hemoglobin: 12.2 g/dL (ref 12.0–15.0)
MCH: 20.4 pg — ABNORMAL LOW (ref 26.0–34.0)
MCHC: 31.4 g/dL (ref 30.0–36.0)
MCV: 65.1 fL — ABNORMAL LOW (ref 80.0–100.0)
Platelets: 178 10*3/uL (ref 150–400)
RBC: 5.98 MIL/uL — ABNORMAL HIGH (ref 3.87–5.11)
RDW: 21.7 % — ABNORMAL HIGH (ref 11.5–15.5)
WBC: 6.9 10*3/uL (ref 4.0–10.5)
nRBC: 0 % (ref 0.0–0.2)

## 2019-09-08 LAB — BASIC METABOLIC PANEL
Anion gap: 10 (ref 5–15)
BUN: 6 mg/dL (ref 6–20)
CO2: 23 mmol/L (ref 22–32)
Calcium: 8.7 mg/dL — ABNORMAL LOW (ref 8.9–10.3)
Chloride: 106 mmol/L (ref 98–111)
Creatinine, Ser: 0.49 mg/dL (ref 0.44–1.00)
GFR calc Af Amer: >60 mL/min (ref >60)
GFR calc non Af Amer: >60 mL/min (ref >60)
Glucose, Bld: 96 mg/dL (ref 70–99)
Potassium: 3.4 mmol/L — ABNORMAL LOW (ref 3.5–5.1)
Sodium: 139 mmol/L (ref 135–145)

## 2019-09-08 SURGERY — ECHOCARDIOGRAM, TRANSESOPHAGEAL
Anesthesia: Moderate Sedation

## 2019-09-08 MED ORDER — CLOPIDOGREL BISULFATE 75 MG PO TABS: 75.0000 mg | ORAL_TABLET | Freq: Every day | ORAL | 0 refills | Status: AC

## 2019-09-08 MED ORDER — BUTAMBEN-TETRACAINE-BENZOCAINE 2-2-14 % EX AERO
INHALATION_SPRAY | CUTANEOUS | Status: DC | PRN
  Administered 2019-09-08: 2 via TOPICAL

## 2019-09-08 MED ORDER — MIDAZOLAM HCL (PF) 5 MG/ML IJ SOLN
INTRAMUSCULAR | Status: AC
  Filled 2019-09-08: qty 2

## 2019-09-08 MED ORDER — SODIUM CHLORIDE 0.9 % IV SOLN: INTRAVENOUS | Status: DC

## 2019-09-08 MED ORDER — FENTANYL CITRATE (PF) 100 MCG/2ML IJ SOLN
INTRAMUSCULAR | Status: AC
  Filled 2019-09-08: qty 4

## 2019-09-08 MED ORDER — MIDAZOLAM HCL (PF) 10 MG/2ML IJ SOLN
INTRAMUSCULAR | Status: DC | PRN
  Administered 2019-09-08: 2 mg via INTRAVENOUS
  Administered 2019-09-08: 1 mg via INTRAVENOUS

## 2019-09-08 MED ORDER — ASPIRIN 81 MG PO TBEC: 81.0000 mg | DELAYED_RELEASE_TABLET | Freq: Every day | ORAL | 11 refills | Status: AC

## 2019-09-08 MED ORDER — FENTANYL CITRATE (PF) 100 MCG/2ML IJ SOLN
INTRAMUSCULAR | Status: DC | PRN
  Administered 2019-09-08: 25 ug via INTRAVENOUS
  Administered 2019-09-08: 50 ug via INTRAVENOUS

## 2019-09-08 MED ORDER — SACUBITRIL-VALSARTAN 24-26 MG PO TABS: 1.0000 | ORAL_TABLET | Freq: Two times a day (BID) | ORAL | 2 refills | Status: DC

## 2019-09-08 MED ORDER — CARVEDILOL 3.125 MG PO TABS: 3.1250 mg | ORAL_TABLET | Freq: Two times a day (BID) | ORAL | 2 refills | Status: DC

## 2019-09-08 NOTE — Progress Notes (Signed)
PT Cancellation Note  Patient Details Name: Jeanne [name] MRN: [id number] DOB: 18-Sep-1965   Cancelled Treatment:    Reason Eval/Treat Not Completed: Patient at procedure or test/unavailable. Pt currently off unit. Will check back to continue with PT POC as able.    Marylynn Pearson 09/08/2019, 2:32 PM   Conni Slipper, PT, DPT Acute Rehabilitation Services Pager: [phone] Office: [phone]

## 2019-09-08 NOTE — CV Procedure (Signed)
    TRANSESOPHAGEAL ECHOCARDIOGRAM   NAME:  Jeanne [name]    MRN: [id number] DOB:  Feb 22, 1966    ADMIT DATE: 09/03/2019  INDICATIONS: Stroke  PROCEDURE:   Informed consent was obtained prior to the procedure. The risks, benefits and alternatives for the procedure were discussed and the patient comprehended these risks.  Risks include, but are not limited to, cough, sore throat, vomiting, nausea, somnolence, esophageal and stomach trauma or perforation, bleeding, low blood pressure, aspiration, pneumonia, infection, trauma to the teeth and death.    Procedural time out performed. The oropharynx was anesthetized with topical 1% benzocaine.    Anesthesia was administered by me.  The patient was administered a total of Versed 3 mg and Fentanyl 75 mcg to achieve and maintain moderate conscious sedation.  The patient's heart rate, blood pressure, and oxygen saturation are monitored continuously during the procedure. The period of conscious sedation is 18 minutes, of which I was present face-to-face 100% of this time.   The transesophageal probe was inserted in the esophagus and stomach without difficulty and multiple views were obtained.   COMPLICATIONS:    There were no immediate complications.  KEY FINDINGS:  1. LVEF 30-35%. 2. LAA clean.  3. No PFO by color. 4. Negative bubble study.  5. No intracardiac source of embolism detected.   6. Full report to follow. 7. Further management per primary team.   Lenna Gilford. Flora Lipps, MD [facility]  [address] [place], [place] [address] [phone]  3:10 PM

## 2019-09-08 NOTE — H&P (View-Only) (Signed)
Progress Note  Patient Name: Jeanne [name] Date of Encounter: 09/08/2019  Primary Cardiologist: Wyline Mood in [place]    Subjective   Doing well. TEE planned for today   Inpatient Medications    Scheduled Meds: .  stroke: mapping our early stages of recovery book   Does not apply Once  . aspirin EC  81 mg Oral Daily  . carvedilol  3.125 mg Oral BID WC  . clopidogrel  75 mg Oral Daily  . sacubitril-valsartan  1 tablet Oral BID   Continuous Infusions: . sodium chloride 75 mL/hr at 09/08/19 0902  . sodium chloride 20 mL/hr at 09/08/19 0901   PRN Meds: acetaminophen **OR** acetaminophen (TYLENOL) oral liquid 160 mg/5 mL **OR** acetaminophen, albuterol, iohexol, senna-docusate   Vital Signs    Vitals:   09/07/19 2310 09/08/19 0108 09/08/19 0402 09/08/19 0730  BP: 117/69 123/77 115/86 (!) 142/84  Pulse:  76 73 65  Resp: (!) 26 (!) 28 (!) 24 20  Temp: 98.6 F (37 C) 97.6 F (36.4 C) 97.8 F (36.6 C) 97.6 F (36.4 C)  TempSrc: Oral Oral Oral Oral  SpO2: 98% 99% 100% 100%  Weight:      Height:        Intake/Output Summary (Last 24 hours) at 09/08/2019 1010 Last data filed at 09/07/2019 1300 Gross per 24 hour  Intake 320 ml  Output --  Net 320 ml   Filed Weights   09/03/19 1349  Weight: 125.2 kg    Physical Exam   General: Well developed, well nourished, NAD Neck: Negative for carotid bruits. No JVD Lungs:Clear to ausculation bilaterally. Breathing is unlabored. Cardiovascular: RRR with S1 S2. No murmurs Abdomen: Soft, non-tender, non-distended. No obvious abdominal masses. Extremities: No edema. Radial pulses 2+ bilaterally Neuro: Alert and oriented. No focal deficits. No facial asymmetry. MAE spontaneously. Psych: Responds to questions appropriately with normal affect.    Labs    Chemistry Recent Labs  Lab 09/03/19 1340 09/03/19 1345 09/06/19 0642 09/07/19 0536 09/08/19 0435  NA 139   < > 140 138 139  K 3.7   < > 3.7 3.5 3.4*  CL 106   < >  108 105 106  CO2 25   < > 23 25 23   GLUCOSE 98   < > 89 94 96  BUN 14   < > 9 7 6   CREATININE 0.48   < > 0.56 0.52 0.49  CALCIUM 8.6*   < > 8.5* 8.6* 8.7*  PROT 6.9  --   --   --   --   ALBUMIN 3.5  --   --   --   --   AST 17  --   --   --   --   ALT 15  --   --   --   --   ALKPHOS 106  --   --   --   --   BILITOT 0.6  --   --   --   --   GFRNONAA >60   < > >60 >60 >60  GFRAA >60   < > >60 >60 >60  ANIONGAP 8   < > 9 8 10    < > = values in this interval not displayed.     Hematology Recent Labs  Lab 09/06/19 0642 09/07/19 0536 09/08/19 0435  WBC 7.9 7.7 6.9  RBC 5.73* 5.85* 5.98*  HGB 11.3* 11.6* 12.2  HCT 37.2 37.7 38.9  MCV 64.9* 64.4* 65.1*  MCH 19.7* 19.8* 20.4*  MCHC 30.4 30.8 31.4  RDW 22.0* 21.8* 21.7*  PLT 173 164 178    Cardiac EnzymesNo results for input(s): TROPONINI in the last 168 hours. No results for input(s): TROPIPOC in the last 168 hours.   BNPNo results for input(s): BNP, PROBNP in the last 168 hours.   DDimer No results for input(s): DDIMER in the last 168 hours.   Radiology    No results found.  Telemetry    09/08/19 NSR- Personally Reviewed  ECG    No new tracing as of 09/08/19- Personally Reviewed  Cardiac Studies   Echocardiogram 09/04/19:   1. Technically difficult; LV function difficult to quantitate.  2. Left ventricular ejection fraction, by estimation, is 25 to 30%. The  left ventricle has severely decreased function. The left ventricle  demonstrates global hypokinesis. The left ventricular internal cavity size  was severely dilated. There is mild  left ventricular hypertrophy. Left ventricular diastolic parameters are  consistent with Grade III diastolic dysfunction (restrictive). Elevated  left atrial pressure.  3. Right ventricular systolic function is normal. The right ventricular  size is normal. Tricuspid regurgitation signal is inadequate for assessing  PA pressure.  4. Left atrial size was mildly dilated.  5. The  mitral valve is normal in structure. Trivial mitral valve  regurgitation. No evidence of mitral stenosis.  6. The aortic valve is tricuspid. Aortic valve regurgitation is trivial.  No aortic stenosis is present.  7. The inferior vena cava is dilated in size with >50% respiratory  variability, suggesting right atrial pressure of 8 mmHg.   Patient Profile     54 y.o. female with a hx of HTN admitted with CVAwho is being seen today for the evaluation of systolic dysfunctionat the request of Dr Pearlean Brownie.  Assessment & Plan    1. CVA: -Presented with right MCA embolic CVA>>felt to be in the setting of cardiomyopathy -Plan per primary team is for further work up with TEE and loop recorder placement however EP states patient is not a candidate for loop implantation with no insurance.  -Plan to proceed with TEE today  -No deficits at this time -Further management per primary team -Continue ASA  2. New cardiomyopathy/ systolic heart failure: -Per echocardiogram with LVEF at 25-30% -Continue carvedilol and will start Entresto 24/26mg  today  -BP and creatinine stable  -Needs to be considered for OP ischemic workup id no improvement on repeat echo in 3-6 months when stable from CVA -No chest pain, does not appear to be fluid volume overloaded on exam today   Signed, Georgie Chard NP-C [facility] Pager: [phone] 09/08/2019, 10:10 AM     For questions or updates, please contact   Please consult [email or web address] for contact info under Cardiology/STEMI.  Attending Note:   The patient was seen and examined.  Agree with assessment and plan as noted above.  Changes made to the above note as needed.  Patient seen and independently examined with Georgie Chard, NP .   We discussed all aspects of the encounter. I agree with the assessment and plan as stated above.  1.  CVA:   Going for TEE today .  Will need  Event monitor at discharge.   Cont plavix per neuro   2.  Acute on chronic combined  CHF:  Starting entresto . On coreg.  Fill follow up in our [place] office.    I have spent a total of 40 minutes with patient reviewing hospital  notes , telemetry, EKGs, labs  and examining patient as well as establishing an assessment and plan that was discussed with the patient. > 50% of time was spent in direct patient care.    Vesta Mixer, Montez Hageman., MD, [facility] 09/08/2019, 10:35 AM [address],  [address] Office [phone] Pager [phone]

## 2019-09-08 NOTE — Progress Notes (Signed)
Off unit via wheelchair for discharge home.

## 2019-09-08 NOTE — Interval H&P Note (Signed)
History and Physical Interval Note:  09/08/2019 2:47 PM  [facility]  has presented today for surgery, with the diagnosis of STROKE.  The various methods of treatment have been discussed with the patient and family. After consideration of risks, benefits and other options for treatment, the patient has consented to  Procedure(s): TRANSESOPHAGEAL ECHOCARDIOGRAM (TEE) (N/A) as a surgical intervention.  The patient's history has been reviewed, patient examined, no change in status, stable for surgery.  I have reviewed the patient's chart and labs.  Questions were answered to the patient's satisfaction.    TEE with bubble for stroke. NPO.  Jeanne [name] T. Flora Lipps, MD [facility]  [address], [address] [place], [place] [address] [phone]  2:47 PM

## 2019-09-08 NOTE — Progress Notes (Addendum)
Progress Note  Patient Name: Jeanne [name] Date of Encounter: 09/08/2019  Primary Cardiologist: Wyline Mood in [place]    Subjective   Doing well. TEE planned for today   Inpatient Medications    Scheduled Meds: .  stroke: mapping our early stages of recovery book   Does not apply Once  . aspirin EC  81 mg Oral Daily  . carvedilol  3.125 mg Oral BID WC  . clopidogrel  75 mg Oral Daily  . sacubitril-valsartan  1 tablet Oral BID   Continuous Infusions: . sodium chloride 75 mL/hr at 09/08/19 0902  . sodium chloride 20 mL/hr at 09/08/19 0901   PRN Meds: acetaminophen **OR** acetaminophen (TYLENOL) oral liquid 160 mg/5 mL **OR** acetaminophen, albuterol, iohexol, senna-docusate   Vital Signs    Vitals:   09/07/19 2310 09/08/19 0108 09/08/19 0402 09/08/19 0730  BP: 117/69 123/77 115/86 (!) 142/84  Pulse:  76 73 65  Resp: (!) 26 (!) 28 (!) 24 20  Temp: 98.6 F (37 C) 97.6 F (36.4 C) 97.8 F (36.6 C) 97.6 F (36.4 C)  TempSrc: Oral Oral Oral Oral  SpO2: 98% 99% 100% 100%  Weight:      Height:        Intake/Output Summary (Last 24 hours) at 09/08/2019 1010 Last data filed at 09/07/2019 1300 Gross per 24 hour  Intake 320 ml  Output --  Net 320 ml   Filed Weights   09/03/19 1349  Weight: 125.2 kg    Physical Exam   General: Well developed, well nourished, NAD Neck: Negative for carotid bruits. No JVD Lungs:Clear to ausculation bilaterally. Breathing is unlabored. Cardiovascular: RRR with S1 S2. No murmurs Abdomen: Soft, non-tender, non-distended. No obvious abdominal masses. Extremities: No edema. Radial pulses 2+ bilaterally Neuro: Alert and oriented. No focal deficits. No facial asymmetry. MAE spontaneously. Psych: Responds to questions appropriately with normal affect.    Labs    Chemistry Recent Labs  Lab 09/03/19 1340 09/03/19 1345 09/06/19 0642 09/07/19 0536 09/08/19 0435  NA 139   < > 140 138 139  K 3.7   < > 3.7 3.5 3.4*  CL 106   < >  108 105 106  CO2 25   < > 23 25 23   GLUCOSE 98   < > 89 94 96  BUN 14   < > 9 7 6   CREATININE 0.48   < > 0.56 0.52 0.49  CALCIUM 8.6*   < > 8.5* 8.6* 8.7*  PROT 6.9  --   --   --   --   ALBUMIN 3.5  --   --   --   --   AST 17  --   --   --   --   ALT 15  --   --   --   --   ALKPHOS 106  --   --   --   --   BILITOT 0.6  --   --   --   --   GFRNONAA >60   < > >60 >60 >60  GFRAA >60   < > >60 >60 >60  ANIONGAP 8   < > 9 8 10    < > = values in this interval not displayed.     Hematology Recent Labs  Lab 09/06/19 0642 09/07/19 0536 09/08/19 0435  WBC 7.9 7.7 6.9  RBC 5.73* 5.85* 5.98*  HGB 11.3* 11.6* 12.2  HCT 37.2 37.7 38.9  MCV 64.9* 64.4* 65.1*  MCH 19.7* 19.8* 20.4*  MCHC 30.4 30.8 31.4  RDW 22.0* 21.8* 21.7*  PLT 173 164 178    Cardiac EnzymesNo results for input(s): TROPONINI in the last 168 hours. No results for input(s): TROPIPOC in the last 168 hours.   BNPNo results for input(s): BNP, PROBNP in the last 168 hours.   DDimer No results for input(s): DDIMER in the last 168 hours.   Radiology    No results found.  Telemetry    09/08/19 NSR- Personally Reviewed  ECG    No new tracing as of 09/08/19- Personally Reviewed  Cardiac Studies   Echocardiogram 09/04/19:   1. Technically difficult; LV function difficult to quantitate.  2. Left ventricular ejection fraction, by estimation, is 25 to 30%. The  left ventricle has severely decreased function. The left ventricle  demonstrates global hypokinesis. The left ventricular internal cavity size  was severely dilated. There is mild  left ventricular hypertrophy. Left ventricular diastolic parameters are  consistent with Grade III diastolic dysfunction (restrictive). Elevated  left atrial pressure.  3. Right ventricular systolic function is normal. The right ventricular  size is normal. Tricuspid regurgitation signal is inadequate for assessing  PA pressure.  4. Left atrial size was mildly dilated.  5. The  mitral valve is normal in structure. Trivial mitral valve  regurgitation. No evidence of mitral stenosis.  6. The aortic valve is tricuspid. Aortic valve regurgitation is trivial.  No aortic stenosis is present.  7. The inferior vena cava is dilated in size with >50% respiratory  variability, suggesting right atrial pressure of 8 mmHg.   Patient Profile     54 y.o. female with a hx of HTN admitted with CVAwho is being seen today for the evaluation of systolic dysfunctionat the request of Dr Sethi.  Assessment & Plan    1. CVA: -Presented with right MCA embolic CVA>>felt to be in the setting of cardiomyopathy -Plan per primary team is for further work up with TEE and loop recorder placement however EP states patient is not a candidate for loop implantation with no insurance.  -Plan to proceed with TEE today  -No deficits at this time -Further management per primary team -Continue ASA  2. New cardiomyopathy/ systolic heart failure: -Per echocardiogram with LVEF at 25-30% -Continue carvedilol and will start Entresto 24/26mg today  -BP and creatinine stable  -Needs to be considered for OP ischemic workup id no improvement on repeat echo in 3-6 months when stable from CVA -No chest pain, does not appear to be fluid volume overloaded on exam today   Signed, Jill McDaniel NP-C [facility] Pager: [phone] 09/08/2019, 10:10 AM     For questions or updates, please contact   Please consult [email or web address] for contact info under Cardiology/STEMI.  Attending Note:   The patient was seen and examined.  Agree with assessment and plan as noted above.  Changes made to the above note as needed.  Patient seen and independently examined with JIll McDaniel, NP .   We discussed all aspects of the encounter. I agree with the assessment and plan as stated above.  1.  CVA:   Going for TEE today .  Will need  Event monitor at discharge.   Cont plavix per neuro   2.  Acute on chronic combined  CHF:  Starting entresto . On coreg.  Fill follow up in our [place] office.    I have spent a total of 40 minutes with patient reviewing hospital  notes , telemetry, EKGs, labs   and examining patient as well as establishing an assessment and plan that was discussed with the patient. > 50% of time was spent in direct patient care.    Vesta Mixer, Montez Hageman., MD, [facility] 09/08/2019, 10:35 AM [address],  [address] Office [phone] Pager [phone]

## 2019-09-08 NOTE — Discharge Summary (Addendum)
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, low EF Active Problems:   HTN (hypertension)   Hyperthyroidism   Middle cerebral artery embolism, right   Cardiomyopathy (HCC)   Allergies as of 09/08/2019   No Known Allergies     Medication List    STOP taking these medications   chlorpheniramine 4 MG tablet Commonly known as: CHLOR-TRIMETON   cyclobenzaprine 5 MG tablet Commonly known as: FLEXERIL   Dymista 137-50 MCG/ACT Susp Generic drug: Azelastine-Fluticasone   methimazole 10 MG tablet Commonly known as: TAPAZOLE   metoprolol tartrate 50 MG tablet Commonly known as: LOPRESSOR   predniSONE 20 MG tablet Commonly known as: DELTASONE   VITAMIN D (CHOLECALCIFEROL) PO     TAKE these medications   albuterol 108 (90 Base) MCG/ACT inhaler Commonly known as: VENTOLIN HFA INHALE TWO PUFFS BY MOUTH EVERY SIX HOURS AS NEEDED   aspirin 81 MG EC tablet Take 1 tablet (81 mg total) by mouth daily. Swallow whole. Start taking on: September 09, 2019   carvedilol 3.125 MG tablet Commonly known as: COREG Take 1 tablet (3.125 mg total) by mouth 2 (two) times daily with a meal. Start taking on: September 09, 2019   clopidogrel 75 MG tablet Commonly known as: PLAVIX Take 1 tablet (75 mg total) by mouth daily for 21 days. Take with aspirin for 21 days, then stop plavix and continue aspirin alone Start taking on: September 09, 2019   sacubitril-valsartan 24-26 MG Commonly known as: ENTRESTO Take 1 tablet by mouth 2 (two) times daily.            Discharge  Care Instructions  (From admission, onward)         Start     Ordered   09/08/19 0000  Discharge wound care:       Comments: See attached instructions on AVS   09/08/19 1724   09/08/19 0000  Discharge wound care:       Comments: Do not life anything over 10# for 1 week. Do  not soak in a tub or pool x 1 week. May shower. Keep wound clean and dry.   09/08/19 1724          LABORATORY STUDIES CBC    Component Value Date/Time   WBC 6.9 09/08/2019 0435   RBC 5.98 (H) 09/08/2019 0435   HGB 12.2 09/08/2019 0435   HCT 38.9 09/08/2019 0435   PLT 178 09/08/2019 0435   MCV 65.1 (L) 09/08/2019 0435   MCH 20.4 (L) 09/08/2019 0435   MCHC 31.4 09/08/2019 0435   RDW 21.7 (H) 09/08/2019 0435   LYMPHSABS 1.6 09/03/2019 1340   MONOABS 0.9 09/03/2019 1340   EOSABS 0.1 09/03/2019 1340   BASOSABS 0.0 09/03/2019 1340   CMP    Component Value Date/Time   NA 139 09/08/2019 0435   K 3.4 (L) 09/08/2019 0435   CL 106 09/08/2019 0435   CO2 23 09/08/2019 0435   GLUCOSE 96 09/08/2019 0435   BUN 6 09/08/2019 0435   CREATININE 0.49 09/08/2019 0435  CALCIUM 8.7 (L) 09/08/2019 0435   PROT 6.9 09/03/2019 1340   ALBUMIN 3.5 09/03/2019 1340   AST 17 09/03/2019 1340   ALT 15 09/03/2019 1340   ALKPHOS 106 09/03/2019 1340   BILITOT 0.6 09/03/2019 1340   GFRNONAA >60 09/08/2019 0435   GFRAA >60 09/08/2019 0435   COAGS Lab Results  Component Value Date   INR 1.2 09/03/2019   Lipid Panel    Component Value Date/Time   CHOL 100 09/04/2019 1003   TRIG 31 09/04/2019 1003   HDL 43 09/04/2019 1003   CHOLHDL 2.3 09/04/2019 1003   VLDL 6 09/04/2019 1003   LDLCALC 51 09/04/2019 1003   HgbA1C  Lab Results  Component Value Date   HGBA1C 5.6 09/04/2019    Alcohol Level    Component Value Date/Time   ETH <10 09/03/2019 1340     SIGNIFICANT DIAGNOSTIC STUDIES CT Angio Head W or Wo Contrast  Result Date: 09/03/2019 CLINICAL DATA:  Left facial droop. Speech abnormality. Left-sided  weakness. Rule out stroke. EXAM: CT ANGIOGRAPHY HEAD AND NECK CT PERFUSION BRAIN TECHNIQUE: Multidetector CT imaging of the head and neck was performed using the standard protocol during bolus administration of intravenous contrast. Multiplanar CT image reconstructions and MIPs were obtained to evaluate the vascular anatomy. Carotid stenosis measurements (when applicable) are obtained utilizing NASCET criteria, using the distal internal carotid diameter as the denominator. Multiphase CT imaging of the brain was performed following IV bolus contrast injection. Subsequent parametric perfusion maps were calculated using RAPID software. CONTRAST:  OMNIPAQUE IOHEXOL 350 MG/ML SOLN COMPARISON:  CT head 09/03/2019 FINDINGS: CTA NECK FINDINGS Aortic arch: Normal aortic arch. Bovine branching pattern. Proximal great vessels widely patent. Right carotid system: Normal right carotid. Negative for stenosis or atherosclerotic disease. Left carotid system: Normal left carotid. Negative for stenosis or atherosclerotic disease. Vertebral arteries: Normal vertebral arteries bilaterally. Skeleton: Scoliosis. Multilevel disc degeneration and spurring. There is significant spinal stenosis at C3-4, C4-5, and C5-6 with expected cord compression. There is significant foraminal encroachment due to spurring bilaterally at C4-5, C5-6, C6-7, C7-T1. No acute skeletal abnormality. Other neck: Overall the right lobe of the thyroid measures 4.4 x 3.9 x 8.0 cm. This is displacing the trachea to the left. Small left lobe of the thyroid. Upper chest: Lung apices clear bilaterally. Review of the MIP images confirms the above findings CTA HEAD FINDINGS Anterior circulation: Suboptimal arterial opacification intracranially due to timing of the injection. Internal carotid arteries patent bilaterally. There is irregularity which may be related to poor opacification versus atherosclerotic disease. No significant calcification in the cavernous  carotid. Anterior cerebral arteries patent bilaterally. Left middle cerebral artery patent without significant stenosis or large vessel occlusion. Vascular irregularity could be due to atherosclerotic disease or artifact. Left M1 segment is patent. There is a high-grade stenosis at the right MCA bifurcation with distal flow. This is a short segment stenosis. There is occlusion of the superior division of the right middle cerebral artery in the M2 segment. Posterior circulation: Both vertebral arteries patent to the basilar. PICA patent bilaterally. Basilar patent. Superior cerebellar and posterior cerebral arteries patent bilaterally without stenosis. Venous sinuses: Normal venous enhancement Anatomic variants: None Review of the MIP images confirms the above findings CT Brain Perfusion Findings: ASPECTS: 10 CBF (<30%) Volume: 0mL Perfusion (Tmax>6.0s) volume: Mismatch Volume: Infarction Location:Right MCA territory involving the frontal parietal lobe. There also are patchy areas of artifact in the left hemisphere. IMPRESSION: 1. Suboptimal arterial opacification making intracranial vessel  analysis difficult. 2. High-grade stenosis right MCA bifurcation. Occlusion of the superior division right MCA M2 branch. 3. Abnormal CT perfusion with no core infarct but 143 mL of delayed perfusion in the right MCA territory. 4. No significant carotid or vertebral artery stenosis in the neck. 5. Severe cervical spine spondylosis causing significant multilevel spinal and foraminal stenosis. 6. Marked enlargement right lobe of the thyroid with heterogeneous enhancement. Thyroid ultrasound recommended. (Ref: J Am Coll Radiol. 2015 Feb;12(2): 143-50). 7. These results were called by telephone at the time of interpretation on 09/03/2019 at 2:15 pm to provider JOSHUA LONG , who verbally acknowledged these results. Electronically Signed   By: Marlan Palau M.D.   On: 09/03/2019 14:18   CT HEAD WO CONTRAST  Result Date:  09/05/2019 CLINICAL DATA:  54 year old female status post code stroke presentation on 09/03/19 with right MCA bifurcation/M2 occlusion status post IV tPA and followup cerebral angiogram but no large vessel occlusion. Follow-up brain MRI yesterday demonstrating petechial hemorrhage, most pronounced in an area of the right inferior frontal gyrus which lacked restricted diffusion. EXAM: CT HEAD WITHOUT CONTRAST TECHNIQUE: Contiguous axial images were obtained from the base of the skull through the vertex without intravenous contrast. COMPARISON:  Brain MRI yesterday, presentation head CT 09/03/2019. FINDINGS: Brain: Mild parenchymal hypodensity - not brand new but progressed since 09/03/2019 - with only subtle hyperintensity at the area of right inferior frontal gyrus confluent petechial hemorrhage yesterday. And infact, some of the SWI signal yesterday and hyperdensity today may be related to a small thrombosed MCA branch (series 3, image 14). No regional mass effect. More confluent cytotoxic edema corresponding to the core infarct in the right parietal lobe, new since 09/03/2019 (series 3, image 22). No overt intracranial hemorrhage by CT. No intracranial mass effect. Stable ventricle size and configuration. Stable gray-white matter differentiation elsewhere. Normal basilar cisterns. Vascular: Mild Calcified atherosclerosis at the skull base. No other hyperdense vessel. Skull: Hyperostosis (normal variant). No acute osseous abnormality identified. Sinuses/Orbits: Visualized paranasal sinuses and mastoids are stable and well pneumatized. Other: Visualized orbits and scalp soft tissues are within normal limits. IMPRESSION: 1. Mild cerebral edema in the right inferior frontal gyrus, increased since 09/03/2019 and now favored to be an area of subacute ischemia. Petechial hemorrhage here on MRI is largely occult by CT, although there is possibly a small hyperdense/thrombosed MCA branch there. 2. Stable cytotoxic edema in the  right parietal lobe core infarct seen on MRI yesterday. 3. No malignant hemorrhagic transformation or intracranial mass effect. 4. No new intracranial abnormality. Electronically Signed   By: Odessa Fleming M.D.   On: 09/05/2019 10:23   CT Angio Neck W and/or Wo Contrast  Result Date: 09/03/2019 CLINICAL DATA:  Left facial droop. Speech abnormality. Left-sided weakness. Rule out stroke. EXAM: CT ANGIOGRAPHY HEAD AND NECK CT PERFUSION BRAIN TECHNIQUE: Multidetector CT imaging of the head and neck was performed using the standard protocol during bolus administration of intravenous contrast. Multiplanar CT image reconstructions and MIPs were obtained to evaluate the vascular anatomy. Carotid stenosis measurements (when applicable) are obtained utilizing NASCET criteria, using the distal internal carotid diameter as the denominator. Multiphase CT imaging of the brain was performed following IV bolus contrast injection. Subsequent parametric perfusion maps were calculated using RAPID software. CONTRAST:  OMNIPAQUE IOHEXOL 350 MG/ML SOLN COMPARISON:  CT head 09/03/2019 FINDINGS: CTA NECK FINDINGS Aortic arch: Normal aortic arch. Bovine branching pattern. Proximal great vessels widely patent. Right carotid system: Normal right carotid. Negative for stenosis or  atherosclerotic disease. Left carotid system: Normal left carotid. Negative for stenosis or atherosclerotic disease. Vertebral arteries: Normal vertebral arteries bilaterally. Skeleton: Scoliosis. Multilevel disc degeneration and spurring. There is significant spinal stenosis at C3-4, C4-5, and C5-6 with expected cord compression. There is significant foraminal encroachment due to spurring bilaterally at C4-5, C5-6, C6-7, C7-T1. No acute skeletal abnormality. Other neck: Overall the right lobe of the thyroid measures 4.4 x 3.9 x 8.0 cm. This is displacing the trachea to the left. Small left lobe of the thyroid. Upper chest: Lung apices clear bilaterally. Review of  the MIP images confirms the above findings CTA HEAD FINDINGS Anterior circulation: Suboptimal arterial opacification intracranially due to timing of the injection. Internal carotid arteries patent bilaterally. There is irregularity which may be related to poor opacification versus atherosclerotic disease. No significant calcification in the cavernous carotid. Anterior cerebral arteries patent bilaterally. Left middle cerebral artery patent without significant stenosis or large vessel occlusion. Vascular irregularity could be due to atherosclerotic disease or artifact. Left M1 segment is patent. There is a high-grade stenosis at the right MCA bifurcation with distal flow. This is a short segment stenosis. There is occlusion of the superior division of the right middle cerebral artery in the M2 segment. Posterior circulation: Both vertebral arteries patent to the basilar. PICA patent bilaterally. Basilar patent. Superior cerebellar and posterior cerebral arteries patent bilaterally without stenosis. Venous sinuses: Normal venous enhancement Anatomic variants: None Review of the MIP images confirms the above findings CT Brain Perfusion Findings: ASPECTS: 10 CBF (<30%) Volume: 64mL Perfusion (Tmax>6.0s) volume: Mismatch Volume: Infarction Location:Right MCA territory involving the frontal parietal lobe. There also are patchy areas of artifact in the left hemisphere. IMPRESSION: 1. Suboptimal arterial opacification making intracranial vessel analysis difficult. 2. High-grade stenosis right MCA bifurcation. Occlusion of the superior division right MCA M2 branch. 3. Abnormal CT perfusion with no core infarct but 143 mL of delayed perfusion in the right MCA territory. 4. No significant carotid or vertebral artery stenosis in the neck. 5. Severe cervical spine spondylosis causing significant multilevel spinal and foraminal stenosis. 6. Marked enlargement right lobe of the thyroid with heterogeneous enhancement.  Thyroid ultrasound recommended. (Ref: J Am Coll Radiol. 2015 Feb;12(2): 143-50). 7. These results were called by telephone at the time of interpretation on 09/03/2019 at 2:15 pm to provider JOSHUA LONG , who verbally acknowledged these results. Electronically Signed   By: Marlan Palau M.D.   On: 09/03/2019 14:18   MR BRAIN WO CONTRAST  Addendum Date: 09/04/2019   ADDENDUM REPORT: 09/04/2019 15:12 ADDENDUM: Study discussed by telephone with Dr. Nicholas Lose on 09/04/2019 at 1511 hours. Electronically Signed   By: Odessa Fleming M.D.   On: 09/04/2019 15:12   Result Date: 09/04/2019 CLINICAL DATA:  54 year old female status post code stroke presentation yesterday with right MCA bifurcation/M2 occlusion status post IV tPA and cerebral angiogram but no large vessel occlusion at that time. EXAM: MRI HEAD WITHOUT CONTRAST TECHNIQUE: Multiplanar, multiecho pulse sequences of the brain and surrounding structures were obtained without intravenous contrast. COMPARISON:  CTA and CTP yesterday. FINDINGS: Brain: Roughly 3 cm area of gyriform and subcortical restricted diffusion in the right parietal lobe (series 5, image 85). No right frontal lobe restricted diffusion. Faint restricted diffusion also in central white matter near the right parietooccipital junction. However, in addition to the right parietal lobe cytotoxic edema with T2 and FLAIR hyperintensity there is a 2 cm area of similar but less pronounced T2 and FLAIR hyperintensity in the  right inferior frontal gyrus (series 11, image 15), although diffusion here is isointense to slightly facilitated. Furthermore, there is evidence of petechial hemorrhage in that right inferior frontal gyrus (series 14, image 31, Heidelberg Classification 1B). There is also a punctate focus of hemosiderin in the affected right parietal lobe (image 32). No intracranial hemorrhage identified elsewhere. No other restricted diffusion. Widely scattered bilateral cerebral white matter T2 and FLAIR  hyperintensity, mostly subcortical. No chronic cortical encephalomalacia identified. Deep gray nuclei, brainstem and cerebellum are within normal limits. No midline shift, mass effect, evidence of mass lesion, ventriculomegaly, extra-axial collection or acute intracranial hemorrhage. Cervicomedullary junction and pituitary are within normal limits. Vascular: Major intracranial vascular flow voids are preserved. Skull and upper cervical spine: Mild C3-C4 cervical spine degeneration and spinal stenosis on series 9, image 12. Hyperostosis of the calvarium. Visualized bone marrow signal is within normal limits. Sinuses/Orbits: Negative orbits.  Paranasal sinuses are clear. Other: Mastoids are clear. Scalp and face soft tissues appear negative. IMPRESSION: 1. Positive for a 3 cm core infarct in the Right parietal lobe. Cytotoxic edema with punctate hemosiderin and no significant mass effect. 2. Positive also for petechial hemorrhage with mild edema in the Right inferior frontal gyrus. No associated mass effect. 3. Moderate for age nonspecific cerebral white matter signal changes. Electronically Signed: By: Odessa Fleming M.D. On: 09/04/2019 15:07   CT CEREBRAL PERFUSION W CONTRAST  Result Date: 09/03/2019 CLINICAL DATA:  Left facial droop. Speech abnormality. Left-sided weakness. Rule out stroke. EXAM: CT ANGIOGRAPHY HEAD AND NECK CT PERFUSION BRAIN TECHNIQUE: Multidetector CT imaging of the head and neck was performed using the standard protocol during bolus administration of intravenous contrast. Multiplanar CT image reconstructions and MIPs were obtained to evaluate the vascular anatomy. Carotid stenosis measurements (when applicable) are obtained utilizing NASCET criteria, using the distal internal carotid diameter as the denominator. Multiphase CT imaging of the brain was performed following IV bolus contrast injection. Subsequent parametric perfusion maps were calculated using RAPID software. CONTRAST:  OMNIPAQUE  IOHEXOL 350 MG/ML SOLN COMPARISON:  CT head 09/03/2019 FINDINGS: CTA NECK FINDINGS Aortic arch: Normal aortic arch. Bovine branching pattern. Proximal great vessels widely patent. Right carotid system: Normal right carotid. Negative for stenosis or atherosclerotic disease. Left carotid system: Normal left carotid. Negative for stenosis or atherosclerotic disease. Vertebral arteries: Normal vertebral arteries bilaterally. Skeleton: Scoliosis. Multilevel disc degeneration and spurring. There is significant spinal stenosis at C3-4, C4-5, and C5-6 with expected cord compression. There is significant foraminal encroachment due to spurring bilaterally at C4-5, C5-6, C6-7, C7-T1. No acute skeletal abnormality. Other neck: Overall the right lobe of the thyroid measures 4.4 x 3.9 x 8.0 cm. This is displacing the trachea to the left. Small left lobe of the thyroid. Upper chest: Lung apices clear bilaterally. Review of the MIP images confirms the above findings CTA HEAD FINDINGS Anterior circulation: Suboptimal arterial opacification intracranially due to timing of the injection. Internal carotid arteries patent bilaterally. There is irregularity which may be related to poor opacification versus atherosclerotic disease. No significant calcification in the cavernous carotid. Anterior cerebral arteries patent bilaterally. Left middle cerebral artery patent without significant stenosis or large vessel occlusion. Vascular irregularity could be due to atherosclerotic disease or artifact. Left M1 segment is patent. There is a high-grade stenosis at the right MCA bifurcation with distal flow. This is a short segment stenosis. There is occlusion of the superior division of the right middle cerebral artery in the M2 segment. Posterior circulation: Both vertebral arteries patent  to the basilar. PICA patent bilaterally. Basilar patent. Superior cerebellar and posterior cerebral arteries patent bilaterally without stenosis. Venous  sinuses: Normal venous enhancement Anatomic variants: None Review of the MIP images confirms the above findings CT Brain Perfusion Findings: ASPECTS: 10 CBF (<30%) Volume: 0mL Perfusion (Tmax>6.0s) volume: 143mL Mismatch Volume: 143mL Infarction Location:Right MCA territory involving the frontal parietal lobe. There also are patchy areas of artifact in the left hemisphere. IMPRESSION: 1. Suboptimal arterial opacification making intracranial vessel analysis difficult. 2. High-grade stenosis right MCA bifurcation. Occlusion of the superior division right MCA M2 branch. 3. Abnormal CT perfusion with no core infarct but 143 mL of delayed perfusion in the right MCA territory. 4. No significant carotid or vertebral artery stenosis in the neck. 5. Severe cervical spine spondylosis causing significant multilevel spinal and foraminal stenosis. 6. Marked enlargement right lobe of the thyroid with heterogeneous enhancement. Thyroid ultrasound recommended. (Ref: J Am Coll Radiol. 2015 Feb;12(2): 143-50). 7. These results were called by telephone at the time of interpretation on 09/03/2019 at 2:15 pm to provider JOSHUA LONG , who verbally acknowledged these results. Electronically Signed   By: Marlan Palauharles  Clark M.D.   On: 09/03/2019 14:18   ECHOCARDIOGRAM COMPLETE  Result Date: 09/04/2019    ECHOCARDIOGRAM REPORT   Patient Name:   Jeanne [name] Date of Exam: 09/04/2019 Medical Rec #:  [id number]        Height:       71.0 in Accession #:    [id number]       Weight:       276.1 lb Date of Birth:  06-10-1965        BSA:          2.418 m Patient Age:    54 years         BP:           111/91 mmHg Patient Gender: F                HR:           102 bpm. Exam Location:  Inpatient Procedure: 2D Echo, Cardiac Doppler and Color Doppler Indications:    CVA  History:        Patient has no prior history of Echocardiogram examinations.                 Stroke; Risk Factors:Hypertension.  Sonographer:    Lavenia AtlasBrooke Strickland Referring Phys: [phone] ERIC  LINDZEN  Sonographer Comments: Patient is morbidly obese. IMPRESSIONS  1. Technically difficult; LV function difficult to quantitate.  2. Left ventricular ejection fraction, by estimation, is 25 to 30%. The left ventricle has severely decreased function. The left ventricle demonstrates global hypokinesis. The left ventricular internal cavity size was severely dilated. There is mild left ventricular hypertrophy. Left ventricular diastolic parameters are consistent with Grade III diastolic dysfunction (restrictive). Elevated left atrial pressure.  3. Right ventricular systolic function is normal. The right ventricular size is normal. Tricuspid regurgitation signal is inadequate for assessing PA pressure.  4. Left atrial size was mildly dilated.  5. The mitral valve is normal in structure. Trivial mitral valve regurgitation. No evidence of mitral stenosis.  6. The aortic valve is tricuspid. Aortic valve regurgitation is trivial. No aortic stenosis is present.  7. The inferior vena cava is dilated in size with >50% respiratory variability, suggesting right atrial pressure of 8 mmHg. FINDINGS  Left Ventricle: Left ventricular ejection fraction, by estimation, is 25 to 30%. The left ventricle has severely decreased function.  The left ventricle demonstrates global hypokinesis. The left ventricular internal cavity size was severely dilated. There is mild left ventricular hypertrophy. Left ventricular diastolic parameters are consistent with Grade III diastolic dysfunction (restrictive). Elevated left atrial pressure. Right Ventricle: The right ventricular size is normal. Right ventricular systolic function is normal. Tricuspid regurgitation signal is inadequate for assessing PA pressure. The tricuspid regurgitant velocity is 1.68 m/s, and with an assumed right atrial  pressure of 3 mmHg, the estimated right ventricular systolic pressure is 14.3 mmHg. Left Atrium: Left atrial size was mildly dilated. Right Atrium: Right  atrial size was normal in size. Pericardium: Trivial pericardial effusion is present. Mitral Valve: The mitral valve is normal in structure. Normal mobility of the mitral valve leaflets. Trivial mitral valve regurgitation. No evidence of mitral valve stenosis. Tricuspid Valve: The tricuspid valve is normal in structure. Tricuspid valve regurgitation is trivial. No evidence of tricuspid stenosis. Aortic Valve: The aortic valve is tricuspid. Aortic valve regurgitation is trivial. No aortic stenosis is present. Pulmonic Valve: The pulmonic valve was normal in structure. Pulmonic valve regurgitation is trivial. No evidence of pulmonic stenosis. Aorta: The aortic root is normal in size and structure. Venous: The inferior vena cava is dilated in size with greater than 50% respiratory variability, suggesting right atrial pressure of 8 mmHg.  Additional Comments: Technically difficult; LV function difficult to quantitate.  LEFT VENTRICLE PLAX 2D LVIDd:         6.30 cm  Diastology LVIDs:         4.60 cm  LV e' lateral:   8.16 cm/s LV PW:         1.20 cm  LV E/e' lateral: 14.3 LV IVS:        1.00 cm  LV e' medial:    6.85 cm/s LVOT diam:     2.30 cm  LV E/e' medial:  17.1 LV SV:         57 LV SV Index:   24 LVOT Area:     4.15 cm  RIGHT VENTRICLE RV Basal diam:  3.60 cm RV S prime:     6.42 cm/s TAPSE (M-mode): 2.8 cm LEFT ATRIUM             Index       RIGHT ATRIUM           Index LA diam:        4.30 cm 1.78 cm/m  RA Area:     16.80 cm LA Vol (A2C):   83.0 ml 34.32 ml/m RA Volume:   49.10 ml  20.30 ml/m LA Vol (A4C):   71.1 ml 29.40 ml/m LA Biplane Vol: 79.0 ml 32.67 ml/m  AORTIC VALVE LVOT Vmax:   90.80 cm/s LVOT Vmean:  54.800 cm/s LVOT VTI:    0.138 m  AORTA Ao Root diam: 2.80 cm MITRAL VALVE                TRICUSPID VALVE MV Area (PHT): 19.45 cm    TR Peak grad:   11.3 mmHg MV Decel Time: 39 msec      TR Vmax:        168.00 cm/s MV E velocity: 117.00 cm/s MV A velocity: 54.20 cm/s   SHUNTS MV E/A ratio:  2.16          Systemic VTI:  0.14 m  Systemic Diam: 2.30 cm Olga Millers MD Electronically signed by Olga Millers MD Signature Date/Time: 09/04/2019/3:13:34 PM    Final    CT HEAD CODE STROKE WO CONTRAST  Result Date: 09/03/2019 CLINICAL DATA:  Code stroke. Left-sided weakness with paralysis. Acute onset. EXAM: CT HEAD WITHOUT CONTRAST TECHNIQUE: Contiguous axial images were obtained from the base of the skull through the vertex without intravenous contrast. COMPARISON:  None. FINDINGS: Brain: The study suffers from some motion degradation. No sign of brain atrophy, old or acute infarction, mass lesion, hemorrhage, hydrocephalus or extra-axial collection. Patient has a tendency towards dural calcification. Vascular: No abnormal vascular finding. Skull: Negative Sinuses/Orbits: Clear/normal Other: None ASPECTS (Alberta Stroke Program Early CT Score) - Ganglionic level infarction (caudate, lentiform nuclei, internal capsule, insula, M1-M3 cortex): 7 - Supraganglionic infarction (M4-M6 cortex): 3 Total score (0-10 with 10 being normal): 10 IMPRESSION: 1. Normal head CT 2. ASPECTS is 10 3. These results were called by telephone at the time of interpretation on 09/03/2019 at 1:35 pm to provider JOSHUA LONG , who verbally acknowledged these results. Electronically Signed   By: Paulina Fusi M.D.   On: 09/03/2019 13:36   TEE  09/08/2019 1. LVEF 30-35%. 2. LAA clean.  3. No PFO by color. 4. Negative bubble study.  5. No intracardiac source of embolism detected.   6. Full report to follow.    HISTORY OF PRESENT ILLNESS Jeanne [name] is a 54 y.o. female who presents in transfer from the [facility] for possible thrombectomy post-tPA. She was seen by Teleneurology at [facility].   Per Teleneurology note: "54 yr old woman with hx of HTN, noted with sudden onset L facial, arm, leg weakness, dysarthria, at 1300, brought by EMS to ER, and NIH 15 for L sided sensory, visual , motor, ataxia, and speech  deficits. CT head unremarkable. Diff Dx; R MCA CVA, other. She is a good candidate for alteplase, the risks, benefits, contraindications reviewed and d.w patient, and sister at bedside. She and sister demonstrate understanding and consent. Benefit given clinical presentation, disabling symptoms outweigh risk of hemorrhage with alteplase. D/w pt, sister, ER. Alteplase bolus administered, no complications, infusion ongoing"  CTA head and neck with CTP with Occlusion of the superior division right MCA M2 branch.  She was transferred emergently to [facility] for possible thrombectomy. On arrival, NIHSS was 6. She was drowsy to somnolent, suggestive of possible hemorrhage following tPA. STAT repeat CT head was obtained in the VIR suite, revealing no hemorrhage. She is not on an antiplatelet agent or an anticoagulant at home. she was LKW at 1300 on 09/03/2019.    HOSPITAL COURSE [name] is a 54 y.o. female with history of hx of HTN and hyperthyroidism (tapazole) who developed sudden onset L facial, arm, leg weakness, and dysarthria, presented to [facility] ED where she received IV tPA (09/03/2019 at 1100) per Teleneurology and was then transferred to [facility] for possible thrombectomy. Cerebral angiogram but no intervention.  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 ASPECTS is 10   MRI head -  Positive for a 3 cm core infarct in the Right parietal lobe. Cytotoxic edema with punctate hemosiderin and no significant mass effect. Positive also for petechial hemorrhage with mild edema in the Right inferior frontal gyrus.   CTA H&N - High-grade stenosis right  MCA bifurcation. Occlusion of the superior division right MCA M2 branch.    CT Perfusion -  no core infarct but 143 mL of delayed perfusion in the right MCA  territory.   Cerebral angio 09/03/2019.  No LVO.  Occlusion of possible angular branch of inferior division of the right MCA and M4 region.  2.8 x 2.4 mm outpouching of the right cavernous carotid possibly aneurysm  2D Echo - EF 25 to 30%. No cardiac source of emboli identified.   TEE no PFO, EF 30-35%. No SOE.   Will not place Loop given low EF and pending workup, no insurance. 30d monitor planned  [employer] Virus 2 - negative  LDL - 51  HgbA1c -5.6  UDS - not collected  No antithrombotic prior to admission, now on aspirin 81 mg and plavix daily. Continue DAPT x 3 weeks then aspirin alone.   Therapy recommendations:  OP PT  (family refused OT)  Disposition:  return home  Cardiomyopathy, acute on chronic Systolic HF, new dx  EF 25 to 16%.   New BB and enestro  For OP ischemic workup if no improvement in 3-6 mos when stable post stroke  Hypertension  Home BP meds: none   Current BP meds: coreg 3.125 bid  BP goal normotensive  Other Stroke Risk Factors  ETOH use, advised to drink no more than 1 alcoholic beverage per day.  Obesity, Body mass index is 38.51 kg/m., recommend weight loss, diet and exercise as appropriate   Other Active Problems  Hyperthyroidism (tapazole) - CTA - Marked enlargement right lobe of the thyroid with heterogeneous enhancement. Thyroid ultrasound recommended.   CTA - Severe cervical spine spondylosis causing significant multilevel spinal and foraminal stenosis.   Approx 2.8 mm x 2.4 mm Outpouching In the right COM region? Aneurysm by cerebral angiogram.  Hypokalemia - 3.3-3.7-3.5-3.4   Approx 2.8 mm x 2.4 mm Outpouching In the right COM region? Aneurysm.   DISCHARGE EXAM Blood pressure 125/87, pulse 73, temperature 97.9 F (36.6 C), temperature source Oral, resp. rate (!) 21, height 5\' 11"  (1.803 m), weight 125.2 kg, SpO2 97 %. Obese middle-aged African-American lady not in distress. . Afebrile. Head is nontraumatic. Neck  is supple without bruit.    Cardiac exam no murmur or gallop. Lungs are clear to auscultation. Distal pulses are well felt. Neurological Exam : Awake alert oriented to time place and person.  No dysarthria.  Extraocular movements are full range without nystagmus.  Blinks to threat bilaterally.  Mild left lower facial weakness.  Tongue midline.  Motor system exam symmetric upper and lower extremity strength without focal weakness.  Deep tendon flexes symmetric.  Plantars downgoing.  Sensation intact.  Coordination accurate.  Gait not tested.  NIH stroke scale score 2.  Premorbid baseline modified Rankin scale 0  Discharge Diet   Hearth healthy thin  liquids  DISCHARGE PLAN  Disposition:  Return home  Outpatient PT. Family refused OT  aspirin 81 mg daily and clopidogrel 75 mg daily for secondary stroke prevention for 3 weeks then aspirin alone.  Ongoing stroke risk factor control by Primary Care Physician at time of discharge  Follow-up PCP Patient, No Pcp Per in 2 weeks - info from [facility] in [facility]  Follow-up in [facility] in 4 weeks, office to schedule an appointment.   Follow-up Cardiology [place] office    35 minutes were spent preparing discharge.  Annie Main, MSN, APRN, ANVP-BC, AGPCNP-BC Advanced Practice Stroke Nurse [facility] See Amion for Schedule & Pager information 09/08/2019 5:24 PM   I have personally obtained history,examined this patient, reviewed notes, independently viewed imaging studies, participated in medical decision making and plan of care.ROS completed by me personally and pertinent positives fully documented  I have made any additions or clarifications directly to the above note. Agree with note above.   Delia Heady, MD Medical Director [facility] Pager: [phone] 09/08/2019 6:18 PM

## 2019-09-08 NOTE — Progress Notes (Signed)
*  PRELIMINARY RESULTS* Echocardiogram Echocardiogram Transesophageal has been performed.  Jeanne [name] 09/08/2019, 3:36 PM

## 2019-09-08 NOTE — Progress Notes (Addendum)
OT Cancellation Note  Patient Details Name: Jeanne [name] MRN: [id number] DOB: [birth year]   Cancelled Treatment:    Reason Eval/Treat Not Completed: Patient at procedure or test/ unavailable. Pt off unit. Will follow when able.  Stephan Draughn/OTS [place] 09/08/2019, 2:21 PM

## 2019-09-11 NOTE — Anesthesia Postprocedure Evaluation (Signed)
Anesthesia Post Note  Patient: [facility]  Procedure(s) Performed: IR WITH ANESTHESIA (N/A )     Patient location during evaluation: PACU Anesthesia Type: General Level of consciousness: awake and patient cooperative Pain management: pain level controlled Vital Signs Assessment: post-procedure vital signs reviewed and stable Respiratory status: spontaneous breathing, nonlabored ventilation, respiratory function stable and patient connected to nasal cannula oxygen Cardiovascular status: blood pressure returned to baseline and stable Postop Assessment: no apparent nausea or vomiting Anesthetic complications: no   No complications documented.  Last Vitals:  Vitals:   09/08/19 1648 09/08/19 2025  BP: 125/87 125/85  Pulse: 73 75  Resp: 18 18  Temp: 36.6 C (!) 36.4 C  SpO2: 97% 100%    Last Pain:  Vitals:   09/08/19 2025  TempSrc: Oral  PainSc:                  Sydny Schnitzler

## 2019-09-12 ENCOUNTER — Encounter (HOSPITAL_COMMUNITY): Payer: Self-pay | Admitting: Cardiovascular Disease

## 2019-09-14 ENCOUNTER — Other Ambulatory Visit (HOSPITAL_COMMUNITY): Payer: Self-pay | Admitting: Interventional Radiology

## 2019-09-14 DIAGNOSIS — I671 Cerebral aneurysm, nonruptured: Secondary | ICD-10-CM

## 2019-09-24 ENCOUNTER — Other Ambulatory Visit: Payer: Self-pay

## 2019-09-24 ENCOUNTER — Encounter (HOSPITAL_COMMUNITY): Payer: Self-pay | Admitting: Physical Therapy

## 2019-09-24 ENCOUNTER — Ambulatory Visit (HOSPITAL_COMMUNITY): Payer: Self-pay | Attending: Neurology | Admitting: Physical Therapy

## 2019-09-24 DIAGNOSIS — R2689 Other abnormalities of gait and mobility: Secondary | ICD-10-CM | POA: Insufficient documentation

## 2019-09-24 DIAGNOSIS — I639 Cerebral infarction, unspecified: Secondary | ICD-10-CM | POA: Insufficient documentation

## 2019-09-24 NOTE — Therapy (Signed)
[facility] [address] [place], [place], [address] Phone: [phone]   Fax:  [phone]  Physical Therapy Evaluation  Patient Details  Name: Jeanne [name] MRN: [id number] Date of Birth: [birth year] Referring Provider (PT): Micki Riley   Encounter Date: 09/24/2019   PT End of Session - 09/24/19 1011    Visit Number 1    Number of Visits 1    Authorization Type self pay    Progress Note Due on Visit --    PT Start Time 1015    PT Stop Time 1045    PT Time Calculation (min) 30 min           Past Medical History:  Diagnosis Date   Allergy    History of chicken pox    Hypertension    Hyperthyroidism     Past Surgical History:  Procedure Laterality Date   ABDOMINAL HYSTERECTOMY  1994   [place]   BUBBLE STUDY  09/08/2019   Procedure: BUBBLE STUDY;  Surgeon: Sande Rives, MD;  Location: [facility] ENDOSCOPY;  Service: Cardiovascular;;   IR ANGIO INTRA EXTRACRAN SEL COM CAROTID INNOMINATE BILAT MOD SED  09/03/2019   IR ANGIO VERTEBRAL SEL SUBCLAVIAN INNOMINATE UNI R MOD SED  09/03/2019   RADIOLOGY WITH ANESTHESIA N/A 09/03/2019   Procedure: IR WITH ANESTHESIA;  Surgeon: Radiologist, Medication, MD;  Location: [facility] OR;  Service: Radiology;  Laterality: N/A;   TEE WITHOUT CARDIOVERSION N/A 09/08/2019   Procedure: TRANSESOPHAGEAL ECHOCARDIOGRAM (TEE);  Surgeon: Sande Rives, MD;  Location: [facility] ENDOSCOPY;  Service: Cardiovascular;  Laterality: N/A;    There were no vitals filed for this visit.    Subjective Assessment - 09/24/19 1045    Subjective Patient reports she had a stroke 09/03/19. She had weakness and numbness all along the left side of her body. Reports her strength and feeling started to come back pretty quickly and she feels she is near baseline. Reports she is doing everything she needs to do but wanted to make sure her balance was near baseline too.    Patient Stated Goals to make sure her balance is good     Currently in Pain? No/denies              [facility] PT Assessment - 09/24/19 0001      Assessment   Medical Diagnosis Acute ischemic Stroke    Referring Provider (PT) Micki Riley    Prior Therapy in hospital       Precautions   Precautions None      Balance Screen   Has the patient fallen in the past 6 months No      Home Environment   Living Environment Private residence    Living Arrangements Spouse/significant other    Available Help at Discharge Family    Type of Home House    Home Access Ramped entrance    Home Layout One level    Home Equipment None      Prior Function   Level of Independence Independent    Vocation Part time employment    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, walking, picking up items       Cognition   Overall Cognitive Status Within Functional Limits for tasks assessed      Transfers   Transfers Sit to Stand;Stand to Sit    Sit to Stand 7: Independent;Without upper extremity assist    Five time sit to stand comments  8.7 seconds  Stand to Sit 7: Independent;Without upper extremity assist      Ambulation/Gait   Ambulation/Gait Yes    Ambulation/Gait Assistance 7: Independent    Ambulation Distance (Feet) 370 Feet    Assistive device None    Ambulation Surface Level;Indoor    Gait Comments      Balance   Balance Assessed Yes      Static Standing Balance   Static Standing - Balance Support No upper extremity supported    Static Standing - Level of Assistance 7: Independent    Static Standing Balance -  Activities  Single Leg Stance - Right Leg;Single Leg Stance - Left Leg;Tandam Stance - Right Leg;Tandam Stance - Left Leg    Static Standing - Comment/# of Minutes SLS L 3 sec and on R is 5 sec; Tandem with left in back more sway noted but 30 seconds bilaterally.       Standardized Balance Assessment   Standardized Balance Assessment Dynamic Gait Index      Dynamic Gait Index   Level Surface Normal    Change in Gait  Speed Normal    Gait with Horizontal Head Turns Normal    Gait with Vertical Head Turns Normal    Gait and Pivot Turn Normal    Step Over Obstacle Normal    Step Around Obstacles Normal    Steps Normal    Total Score 24                      Objective measurements completed on examination: See above findings.               PT Education - 09/24/19 1043    Education Details in current presentation, basic HEP and balance    Person(s) Educated Patient    Methods Explanation    Comprehension Verbalized understanding            PT Short Term Goals - 09/24/19 1045      PT SHORT TERM GOAL #1   Title Patient will be independent in basic HEP to improve functional outcomes    Time 1    Period Weeks    Status New    Target Date 10/01/19                     Plan - 09/24/19 1047    Clinical Impression Statement Patient is s/p acute ischemic CVA on 09/03/19 which resulted in left sided weakness and numbness. Since her stroke she has recovered back to baseline and is doing everything she needs to do at home. Assessed static and dynamic balance as well as functional mobility and patient does not score with any fall risk at this time. Static balance is poor with single leg stance, but this is present bilaterally. Educated patient in basic HEP to work on Hospital doctor at home. Answered all questions prior to end of session. Patient does not need physical therapy at this time and instructed to follow up with MD if she feels that changes.    Personal Factors and Comorbidities Age    Examination-Activity Limitations Locomotion Level    Stability/Clinical Decision Making Stable/Uncomplicated    Clinical Decision Making Low    Rehab Potential Good    PT Frequency One time visit    PT Treatment/Interventions ADLs/Self Care Home Management;Therapeutic exercise    PT Next Visit Plan one time visit    PT Home Exercise Plan SLS, tandem with progressions  Consulted  and Agree with Plan of Care Patient           Patient will benefit from skilled therapeutic intervention in order to improve the following deficits and impairments:  Decreased balance  Visit Diagnosis: Acute ischemic stroke [facility])  Balance problem     Problem List Patient Active Problem List   Diagnosis Date Noted   Cardiomyopathy (HCC) 09/08/2019   Stroke (cerebrum) (HCC) - R MCA embolic d/t new cardiomyopathy, low EF 09/03/2019   Middle cerebral artery embolism, 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   Left-sided low back pain without sciatica 05/12/2014   Viral URI with cough 05/12/2014   HTN (hypertension) 07/09/2013   Hyperthyroidism 07/09/2013    10:55 AM, 09/24/19 Tereasa Coop, DPT Physical Therapy with [facility]  [phone] office  [facility] [address] [place], [place], [address] Phone: [phone]   Fax:  [phone]  Name: Jeanne [name] MRN: [id number] Date of Birth: 07/18/1965

## 2019-09-24 NOTE — Progress Notes (Signed)
Cardiology Office Note    Date:  09/27/2019   ID:  Jeanne [name], DOB 1965-09-27, MRN [id number]  PCP:  Patient, No Pcp Per  Cardiologist:  Dina Rich, MD  Electrophysiologist:  None   Chief Complaint: f/u LV dysfunction  History of Present Illness:   Jeanne [name] is a 54 y.o. female with history of HTN, hyperthyroidism, recent stroke, newly diagnosed LV dysfunction/systolic heart failure, thyroid enlargement, morbid obesity who presents for post-hospital follow-up. She was admitted earlier this month with sudden onset left facial, arm, leg weakness, and dysarthria, found to have right MCVA infarct. CTA showed high-grade stenosis right MCA bifurcation, occlusion of the superior division right MCA M2 branch. 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 Covid was negative. 2D echocardiogram was found to show EF 25-30%, mild LVH, grade 3 DD, mild LAE. TEE was negative for embolism. Loop was referred "due to low EF and pending workup, no insurance." 30 day monitor planned. She was placed on ASA + Plavix x 3 weeks then aspirin alone by neurology. She was also started on carvedilol + Entresto.  She is seen back for follow-up today. She notes a prior history of hypo then hyperthyroidism. She was supposed to go on a medication for this then Covid hit and she did not have follow-up. She also reports a history of DOE and was supposed to see cardiology before the pandemic hit. She returns for post-hospital visit today reporting she feels like she is doing well. She denies any recent chest or edema. No orthopnea. She has NYHA class II dyspnea which is stable. Taking all meds without adverse effect. Has f/u with neurology and IR next month. She has not heard anything about the monitor yet.   Labwork independently reviewed: 09/2018 Hgb 12.2, plt  178, TSH <0.010, K 3.4, Cr 0.49, LDL 51, A1C 5.6, abnormal LFTs  Past Medical History:  Diagnosis Date  . Allergy   . History of chicken pox   . Hypertension   . Hyperthyroidism   . LV dysfunction   . Morbid obesity (HCC)   . Systolic heart failure (HCC)   . Thyroid enlargement    on imaging    Past Surgical History:  Procedure Laterality Date  . ABDOMINAL HYSTERECTOMY  1994   [place]  . BUBBLE STUDY  09/08/2019   Procedure: BUBBLE STUDY;  Surgeon: Sande Rives, MD;  Location: [facility] ENDOSCOPY;  Service: Cardiovascular;;  . IR ANGIO INTRA EXTRACRAN SEL COM CAROTID INNOMINATE BILAT MOD SED  09/03/2019  . IR ANGIO VERTEBRAL SEL SUBCLAVIAN INNOMINATE UNI R MOD SED  09/03/2019  . RADIOLOGY WITH ANESTHESIA N/A 09/03/2019   Procedure: IR WITH ANESTHESIA;  Surgeon: Radiologist, Medication, MD;  Location: [facility] OR;  Service: Radiology;  Laterality: N/A;  . TEE WITHOUT CARDIOVERSION N/A 09/08/2019   Procedure: TRANSESOPHAGEAL ECHOCARDIOGRAM (TEE);  Surgeon: Sande Rives, MD;  Location: [facility] ENDOSCOPY;  Service: Cardiovascular;  Laterality: N/A;    Current Medications: Current Meds  Medication Sig  . albuterol (PROVENTIL HFA;VENTOLIN HFA) 108 (90 Base) MCG/ACT inhaler INHALE TWO PUFFS BY MOUTH EVERY SIX HOURS AS NEEDED  . aspirin EC 81 MG EC tablet Take 1 tablet (81 mg total) by mouth daily. Swallow whole.  . carvedilol (COREG) 3.125 MG tablet Take 1 tablet (3.125 mg total) by mouth 2 (two) times daily with a meal.  . clopidogrel (PLAVIX)  75 MG tablet Take 1 tablet (75 mg total) by mouth daily for 21 days. Take with aspirin for 21 days, then stop plavix and continue aspirin alone  . sacubitril-valsartan (ENTRESTO) 24-26 MG Take 1 tablet by mouth 2 (two) times daily.      Allergies:   Patient has no known allergies.   Social History   Socioeconomic History  . Marital status: Single    Spouse name: Not on file  . Number of children: Not on file  . Years of education: Not on file  .  Highest education level: Not on file  Occupational History  . Not on file  Tobacco Use  . Smoking status: Never Smoker  . Smokeless tobacco: Never Used  Substance and Sexual Activity  . Alcohol use: Yes    Comment: socially  . Drug use: No  . Sexual activity: Not on file  Other Topics Concern  . Not on file  Social History Narrative  . Not on file   Social Determinants of Health   Financial Resource Strain:   . Difficulty of Paying Living Expenses:   Food Insecurity:   . Worried About Programme researcher, broadcasting/film/video in the Last Year:   . Barista in the Last Year:   Transportation Needs:   . Freight forwarder (Medical):   Marland Kitchen Lack of Transportation (Non-Medical):   Physical Activity:   . Days of Exercise per Week:   . Minutes of Exercise per Session:   Stress:   . Feeling of Stress :   Social Connections:   . Frequency of Communication with Friends and Family:   . Frequency of Social Gatherings with Friends and Family:   . Attends Religious Services:   . Active Member of Clubs or Organizations:   . Attends Banker Meetings:   Marland Kitchen Marital Status:      Family History:  The patient's family history includes Dementia in her mother; Diabetes in her maternal grandmother; Hypertension in her maternal grandmother.  ROS:   Please see the history of present illness.  All other systems are reviewed and otherwise negative.    EKGs/Labs/Other Studies Reviewed:    Studies reviewed are outlined and summarized above. Reports included below if pertinent.  2D echo 09/04/19 1. Technically difficult; LV function difficult to quantitate.  2. Left ventricular ejection fraction, by estimation, is 25 to 30%. The  left ventricle has severely decreased function. The left ventricle  demonstrates global hypokinesis. The left ventricular internal cavity size  was severely dilated. There is mild  left ventricular hypertrophy. Left ventricular diastolic parameters are  consistent  with Grade III diastolic dysfunction (restrictive). Elevated  left atrial pressure.  3. Right ventricular systolic function is normal. The right ventricular  size is normal. Tricuspid regurgitation signal is inadequate for assessing  PA pressure.  4. Left atrial size was mildly dilated.  5. The mitral valve is normal in structure. Trivial mitral valve  regurgitation. No evidence of mitral stenosis.  6. The aortic valve is tricuspid. Aortic valve regurgitation is trivial.  No aortic stenosis is present.  7. The inferior vena cava is dilated in size with >50% respiratory  variability, suggesting right atrial pressure of 8 mmHg.   TEE 09/08/19 1. Left ventricular ejection fraction, by estimation, is 30 to 35%. The  left ventricle has moderately decreased function. The left ventricle  demonstrates global hypokinesis. The left ventricular internal cavity size  was mildly to moderately dilated.  2. Right ventricular systolic  function is normal. The right ventricular  size is normal.  3. Left atrial size was mild to moderately dilated. No left atrial/left  atrial appendage thrombus was detected. The LAA emptying velocity was 32  cm/s.  4. Right atrial size was mildly dilated.  5. The mitral valve is grossly normal. Mild mitral valve regurgitation.  No evidence of mitral stenosis.  6. The aortic valve is tricuspid. Aortic valve regurgitation is not  visualized. No aortic stenosis is present.  7. Agitated saline contrast bubble study was negative, with no evidence  of any interatrial shunt.   Conclusion(s)/Recommendation(s): No LA/LAA thrombus identified. Negative  bubble study for interatrial shunt. No intracardiac source of embolism  detected on this on this transesophageal echocardiogram.     EKG:  EKG is not ordered today but reviewed from recent admission - sinus tach with PACs. Recent Labs: 09/03/2019: ALT 15 09/07/2019: TSH <0.010 09/08/2019: BUN 6; Creatinine, Ser 0.49;  Hemoglobin 12.2; Platelets 178; Potassium 3.4; Sodium 139  Recent Lipid Panel    Component Value Date/Time   CHOL 100 09/04/2019 1003   TRIG 31 09/04/2019 1003   HDL 43 09/04/2019 1003   CHOLHDL 2.3 09/04/2019 1003   VLDL 6 09/04/2019 1003   LDLCALC 51 09/04/2019 1003    PHYSICAL EXAM:    VS:  BP (!) 102/58   Pulse 79   Ht 5\' 11"  (1.803 m)   Wt (!) 264 lb (119.7 kg)   SpO2 99%   BMI 36.82 kg/m   BMI: Body mass index is 36.82 kg/m.  GEN: Well nourished, well developed obese AAF, in no acute distress HEENT: normocephalic, atraumatic Neck: no JVD, carotid bruits, or masses Cardiac: RRR; no murmurs, rubs, or gallops, no edema  Respiratory:  clear to auscultation bilaterally, normal work of breathing GI: soft, nontender, nondistended, + BS MS: no deformity or atrophy Skin: warm and dry, no rash Neuro:  Alert and Oriented x 3, Strength and sensation are intact, follows commands Psych: euthymic mood, full affect  Wt Readings from Last 3 Encounters:  09/27/19 (!) 264 lb (119.7 kg)  09/03/19 276 lb 1.6 oz (125.2 kg)  03/18/18 278 lb (126.1 kg)     ASSESSMENT & PLAN:   1. Recent stroke - as previously requested, will arrange 30 day event monitor for recent CVA. Per neurology she is now on ASA + Plavix and has follow-up with them in August.  2. LV dysfunction/new cardiomyopathy - she appears euvolemic on exam. Reviewed 2g sodium restriction, 2L fluid restriction, daily weights with patient. Continue carvedilol and Entresto. Initial BP was 118/54 by CMA, repeat 102/58 by me personally. Given recent CVA will hold off on aggressive med titration so as not to precipitate hypoperfusion. Further med titration can be pursued in future visits. We did discuss monitoring her BP and calling us if BP is <100 or >130. I will reach out to Dr. Wyline MoodBranch to inquire about his thoughts on ischemic workup. We typically do not pursue heart cath so close to stroke, but coronary CTA may be a consideration.  Will also recheck BMET given recent Entresto initiation. I also wonder to what degree a thyroid abnormality could be contributing. Ultimately once medically optimized will need repeat echocardiogram in several months to determine whether ICD needs to be considered. 3. Hyperthyroidism and thyroid enlargement with abnormal TSH - prior hx as outlined above. TSH in the hospital was <0.010. Will recheck today with free T4 and total T3. She does not presently have a primary care  doctor or anyone who follows this so we will also obtain thyroid US to evaluate the enlargement seen on CT. Information given regarding primary care resources here in [place]. 4. Essential hypertension - controlled on current regimen. 5. Hypokalemia - recheck BMET today and also obtain baseline Mg.  Disposition: F/u with Dr. Rushie Chestnut in 6-7 weeks.  Medication Adjustments/Labs and Tests Ordered: Current medicines are reviewed at length with the patient today.  Concerns regarding medicines are outlined above. Medication changes, Labs and Tests ordered today are summarized above and listed in the Patient Instructions accessible in Encounters.    Signed, Laurann Montana, PA-C  09/27/2019 2:32 PM    Jeanne [name] - [place] Location in [facility] [address] [place], [place] [address] Ph: [phone]; Fax [phone]

## 2019-09-27 ENCOUNTER — Ambulatory Visit (INDEPENDENT_AMBULATORY_CARE_PROVIDER_SITE_OTHER): Payer: Self-pay | Admitting: Physician Assistant

## 2019-09-27 ENCOUNTER — Other Ambulatory Visit: Payer: Self-pay

## 2019-09-27 ENCOUNTER — Other Ambulatory Visit (HOSPITAL_COMMUNITY)
Admission: RE | Admit: 2019-09-27 | Discharge: 2019-09-27 | Disposition: A | Discharge status: Home / Self Care | Payer: Self-pay | Source: Ambulatory Visit | Attending: Physician Assistant | Admitting: Physician Assistant

## 2019-09-27 ENCOUNTER — Encounter: Payer: Self-pay | Admitting: Physician Assistant

## 2019-09-27 VITALS — BP 102/58 | HR 79 | Ht 71.0 in | Wt 264.0 lb

## 2019-09-27 DIAGNOSIS — I10 Essential (primary) hypertension: Secondary | ICD-10-CM

## 2019-09-27 DIAGNOSIS — I519 Heart disease, unspecified: Secondary | ICD-10-CM

## 2019-09-27 DIAGNOSIS — E049 Nontoxic goiter, unspecified: Secondary | ICD-10-CM

## 2019-09-27 DIAGNOSIS — Z8673 Personal history of transient ischemic attack (TIA), and cerebral infarction without residual deficits: Secondary | ICD-10-CM

## 2019-09-27 DIAGNOSIS — R7989 Other specified abnormal findings of blood chemistry: Secondary | ICD-10-CM

## 2019-09-27 DIAGNOSIS — E0590 Thyrotoxicosis, unspecified without thyrotoxic crisis or storm: Secondary | ICD-10-CM

## 2019-09-27 DIAGNOSIS — E876 Hypokalemia: Secondary | ICD-10-CM

## 2019-09-27 LAB — T4, FREE: Free T4: 2.92 ng/dL — ABNORMAL HIGH (ref 0.61–1.12)

## 2019-09-27 NOTE — Patient Instructions (Addendum)
Medication Instructions:   Your physician recommends that you continue on your current medications as directed. Please refer to the Current Medication list given to you today.  *If you need a refill on your cardiac medications before your next appointment, please call your pharmacy*   Lab Work: BMET MAG TSH  FREET4 AND T3 TODAY   If you have labs (blood work) drawn today and your tests are completely normal, you will receive your results only by: Marland Kitchen MyChart Message (if you have MyChart) OR . A paper copy in the mail If you have any lab test that is abnormal or we need to change your treatment, we will call you to review the results.   Testing/Procedures: Your physician has recommended that you wear an event monitor. Event monitors are medical devices that record the heart's electrical activity. Doctors most often [place] these monitors to diagnose arrhythmias. Arrhythmias are problems with the speed or rhythm of the heartbeat. The monitor is a small, portable device. You can wear one while you do your normal daily activities. This is usually used to diagnose what is causing palpitations/syncope (passing out). PLEASE CONTACT [phone] FOR FURTHER STEPS  THYROID ULTRASOUND AT [facility]    Follow-Up: At [facility], you and your health needs are our priority.  As part of our continuing mission to provide you with exceptional heart care, we have created designated Provider Care Teams.  These Care Teams include your primary Cardiologist (physician) and Advanced Practice Providers (APPs -  Physician Assistants and Nurse Practitioners) who all work together to provide you with the care you need, when you need it.  We recommend signing up for the patient portal called "MyChart".  Sign up information is provided on this After Visit Summary.  MyChart is used to connect with patients for Virtual Visits (Telemedicine).  Patients are able to view lab/test results, encounter notes, upcoming  appointments, etc.  Non-urgent messages can be sent to your provider as well.   To learn more about what you can do with MyChart, go to [email or web address].    Your next appointment:   6-7 week(s)  The format for your next appointment:   In Person  Provider:   You may see Dina Rich, MD or one of the following Advanced Practice Providers on your designated Care Team:    Randall An, PA-C   Jacolyn Reedy, [place]    FOR A PRIMARY CARE PROVIDER SUGGESTION :   [facility] [address] [place] [place], [place] [address] [phone]  Other Instructions  One of your heart tests showed weakness of the heart muscle recently. This may make you more susceptible to weight gain from fluid retention, which can lead to symptoms that we call heart failure. Please follow these special instructions:  1. Follow a low-salt diet - you are allowed no more than 2,000mg  of sodium per day. Watch your fluid intake. In general, you should not be taking in more than 2 liters of fluid per day (no more than 8 glasses per day). This includes sources of water in foods like soup, coffee, tea, milk, etc. 2. Weigh yourself on the same scale at same time of day and keep a log. 3. Call your doctor: (Anytime you feel any of the following symptoms)  - 3lb weight gain overnight or 5lb within a few days - Shortness of breath, with or without a dry hacking cough  - Swelling in the hands, feet or stomach  - If you  have to sleep on extra pillows at night in order to breathe   IT IS IMPORTANT TO LET YOUR DOCTOR KNOW EARLY ON IF YOU ARE HAVING SYMPTOMS SO WE CAN HELP YOU!

## 2019-09-28 LAB — T3: T3, Total: 311 ng/dL — ABNORMAL HIGH (ref 71–180)

## 2019-09-28 NOTE — Addendum Note (Signed)
Addended by: Marlyn Corporal A on: 09/28/2019 11:18 AM   Modules accepted: Orders

## 2019-09-29 ENCOUNTER — Ambulatory Visit (HOSPITAL_COMMUNITY): Payer: Self-pay

## 2019-09-30 ENCOUNTER — Telehealth: Payer: Self-pay | Admitting: Physician Assistant

## 2019-09-30 DIAGNOSIS — E0590 Thyrotoxicosis, unspecified without thyrotoxic crisis or storm: Secondary | ICD-10-CM

## 2019-09-30 MED ORDER — METHIMAZOLE 10 MG PO TABS: 10.0000 mg | ORAL_TABLET | Freq: Every day | ORAL | 1 refills | Status: DC

## 2019-09-30 NOTE — Telephone Encounter (Signed)
Called patient. No answer. Left message to call back.  

## 2019-09-30 NOTE — Progress Notes (Signed)
I would repeat echo in 3 months and treat her HF and thyroid medically, if still low then then consider cath. Give some additional time to recover from her cva as well. I would go ahead and refer to endocrine for the thyroid since no pcp   Dominga Ferry MD

## 2019-09-30 NOTE — Telephone Encounter (Signed)
° °  Patient recently seen in the office for f/u of new cardiomyopathy. During her hospital stay she was noted to have suppressed TSH worrisome for hyperthyroidism. She actually appears to have a long history of this but without consistent treatment. Her free T4 and total T3 also confirm suspicion for this. Recent CT demonstrated thyroid enlargement so I ordered thyroid US since she does not have a PCP or anyone else following this right now. I called Dr. Isidoro Donning office with [facility] to discuss her case. He feels she would benefit from RAI therapy but given that she had recent contrast exposure while admitted with stroke this needs to be deferred therefore he suggests starting Tapazole 10mg  daily with breakfast. He is also graciously willing to see her ASAP.  [place] staff, can you do the following: - Please call patient and relay the above information to her and send in rx for Tapazole as above for #30 with 1 refill to preferred pharmacy - Please find out if she is available in the next few days and contact [facility] with this information, let them know I spoke with Dr. about getting her in ASAP - please make sure the patient knows it is EXTREMELY important to keep this follow-up as I know in the past it's fallen by the wayside but long term this thyroid problem can have serious dangerous effects - On the recent labwork we did, for whatever reason they did not draw the BMET and Mg that I needed. At this point the TSH is a moot point since the free hormones proved she is hyperthyroid, and she had a recent TSH earlier this month so I am less concerned about that. But given the medication adjustments in the hospital, NEED to get the BMET/Mg, please follow up - Please let her know I also conferred with Dr. Fransico Him about workup of her heart weakness. He would recommend treatment of the thyroid disorder, continuing medical therapy for CHF and ordering a repeat echo in  3 months to reassess LVEF - if heart still weak at that time we will need to consider a heart cath - When she is treated for her hyperthyroidism, she may experience changes in her blood pressure (high thyroid often causes high BP - so once treated I would make sure she knows to monitor for any low readings). Would check at least daily for the first few weeks on the thyroid treatment and call Wyline Mood if running less than 105 consistently or any symptoms of low blood pressure.  Will also cc to Dr. [place] to make him aware. Thank you! Paytience Bures

## 2019-10-01 NOTE — Telephone Encounter (Signed)
-----   Message from Laurann Montana, [place] sent at 09/30/2019 12:04 PM EDT ----- In addition to making sure this patient gets her BMET and Mg (see notes below), can we please go ahead and refer her to endocrinology ASAP for hyperthyroidism? I will plan to call them today when they reopen to discuss interim treatment measures. Will relay further recs when known since she does not have a PCP or anyone else to manage this in the meantime

## 2019-10-01 NOTE — Telephone Encounter (Signed)
I spoke with patient on 09/28/19 and asked her to come and get labs that were not done initially. She said there was a family emergency and she was leaving town.She said she would stop by office before leaving town.She did not come by here.Patients phone goes to voicemail.

## 2019-10-04 ENCOUNTER — Telehealth (HOSPITAL_COMMUNITY): Payer: Self-pay

## 2019-10-04 LAB — MISC LABCORP TEST (SEND OUT)

## 2019-10-04 NOTE — Telephone Encounter (Signed)
Called pt regarding upcoming appt, no answer, left vm. AW

## 2019-10-07 ENCOUNTER — Other Ambulatory Visit (HOSPITAL_COMMUNITY): Payer: Self-pay

## 2019-10-08 ENCOUNTER — Telehealth: Payer: Self-pay

## 2019-10-08 ENCOUNTER — Telehealth: Payer: Self-pay | Admitting: Student

## 2019-10-08 NOTE — Telephone Encounter (Signed)
Pt notified of test results

## 2019-10-08 NOTE — Telephone Encounter (Signed)
Unable to reach patient by phone, letter mailed.

## 2019-10-08 NOTE — Telephone Encounter (Signed)
Pt states she rec'd a call from the office FB:[email or web address] you doesn't remember who the call was from.  Please call [phone]   Thanks renee

## 2019-10-11 ENCOUNTER — Other Ambulatory Visit: Payer: Self-pay | Admitting: *Deleted

## 2019-10-11 ENCOUNTER — Telehealth: Payer: Self-pay | Admitting: *Deleted

## 2019-10-11 ENCOUNTER — Other Ambulatory Visit (HOSPITAL_COMMUNITY)
Admission: RE | Admit: 2019-10-11 | Discharge: 2019-10-11 | Disposition: A | Discharge status: Home / Self Care | Payer: Self-pay | Source: Ambulatory Visit | Attending: Physician Assistant | Admitting: Physician Assistant

## 2019-10-11 DIAGNOSIS — R7989 Other specified abnormal findings of blood chemistry: Secondary | ICD-10-CM | POA: Insufficient documentation

## 2019-10-11 DIAGNOSIS — E049 Nontoxic goiter, unspecified: Secondary | ICD-10-CM | POA: Insufficient documentation

## 2019-10-11 DIAGNOSIS — Z79899 Other long term (current) drug therapy: Secondary | ICD-10-CM

## 2019-10-11 DIAGNOSIS — E0590 Thyrotoxicosis, unspecified without thyrotoxic crisis or storm: Secondary | ICD-10-CM | POA: Insufficient documentation

## 2019-10-11 LAB — BASIC METABOLIC PANEL
Anion gap: 10 (ref 5–15)
BUN: 16 mg/dL (ref 6–20)
CO2: 26 mmol/L (ref 22–32)
Calcium: 9.1 mg/dL (ref 8.9–10.3)
Chloride: 102 mmol/L (ref 98–111)
Creatinine, Ser: 0.53 mg/dL (ref 0.44–1.00)
GFR calc Af Amer: >60 mL/min (ref >60)
GFR calc non Af Amer: >60 mL/min (ref >60)
Glucose, Bld: 118 mg/dL — ABNORMAL HIGH (ref 70–99)
Potassium: 3.3 mmol/L — ABNORMAL LOW (ref 3.5–5.1)
Sodium: 138 mmol/L (ref 135–145)

## 2019-10-11 LAB — MAGNESIUM: Magnesium: 1.6 mg/dL — ABNORMAL LOW (ref 1.7–2.4)

## 2019-10-11 MED ORDER — MAGNESIUM OXIDE 400 MG PO CAPS: 400.0000 mg | ORAL_CAPSULE | Freq: Every day | ORAL | 3 refills | Status: AC

## 2019-10-11 MED ORDER — LOSARTAN POTASSIUM 25 MG PO TABS
25.0000 mg | ORAL_TABLET | Freq: Every day | ORAL | 3 refills | Status: DC
Start: 2019-10-11 — End: 2020-12-25

## 2019-10-11 MED ORDER — POTASSIUM CHLORIDE CRYS ER 20 MEQ PO TBCR
20.0000 meq | EXTENDED_RELEASE_TABLET | Freq: Every day | ORAL | 3 refills | Status: DC
Start: 2019-10-11 — End: 2019-10-11

## 2019-10-11 MED ORDER — POTASSIUM CHLORIDE CRYS ER 20 MEQ PO TBCR
40.0000 meq | EXTENDED_RELEASE_TABLET | Freq: Every day | ORAL | 3 refills | Status: DC
Start: 2019-10-11 — End: 2019-10-18

## 2019-10-11 NOTE — Patient Outreach (Signed)
[facility]) Care Management  10/11/2019  Jeanne [name] 02-12-1966 [id number]   Subjective: Telephone call to patient's home  / mobile number, no answer, left HIPAA compliant voicemail message, and requested call back.     Objective: Per KPN (Knowledge Performance Now, point of care tool) and chart review, patient hospitalized 09/03/2019 -  09/08/2019 for Stroke (cerebrum), Middle cerebral artery embolism, right.   Patient also has a history of hypertension, hyperthyroidism, and Cardiomyopathy.       Assessment: Received Self Pay EMMI Stroke Red Flag follow up referral on 09/28/2019.   Red Flag Alert Trigger, Day # 13, patient answered no to the following question: Went to follow-up appointment?        Plan:RNCM will send unsuccessful outreach letter, [facility] pamphlet, handout: Know Before You Go, will call patient for 2nd telephone outreach attempt within 7 business days, [facility] EMMI follow up, and proceed with case closure, after 4th unsuccessful outreach call.      Jonuel Butterfield H. Gardiner Barefoot, BSN, CCM [facility] Care Management [facility] Telephonic CM Phone: [phone] Fax: [phone]

## 2019-10-11 NOTE — Addendum Note (Signed)
Addended by: Kerney Elbe on: 10/11/2019 12:59 PM   Modules accepted: Orders

## 2019-10-11 NOTE — Telephone Encounter (Signed)
Pt states that she is unable to pay for entresto she would like to know if there is another med that she can take in it's place. She will apply for patient assistance and with the company.

## 2019-10-11 NOTE — Telephone Encounter (Addendum)
Lab order placed.

## 2019-10-11 NOTE — Telephone Encounter (Signed)
-----   Message from Laurann Montana, PA-C sent at 10/11/2019 10:50 AM EDT ----- Labs reviewed, low potassium and magnesium. Please start KCl daily and MagOx 400mg  once daily. Please increase dietary intake of healthy sources of potassium including bananas, squash, yogurt, white beans, sweet potatoes, leafy greens, and avocados. Recheck BMET/Mg on Friday AM. If Entresto copay card or 30 day free card are not an option, OK to switch to losartan - would start 25mg  once daily given her recent baseline BP which may decrease further after treatment for her thyroid. Also please provide update on seeing endocrinologist - need to see them ASAP for treatment as this could have provided a link to why she has heart failure and stroke. Dayna

## 2019-10-11 NOTE — Telephone Encounter (Addendum)
Please see if we can get her 30 day free card and copay card in the interim. This is the patient that we need to get her labs on - BMET, Mg, also needs to see endocrinology ASAP. Cannot advise on alternative med on Entresto until we see what her labs show to ensure her kidneys are working normally - edited to add: would likely plan to transition to ARB only but can she come in for labs today please? Let me know if she has been out of her Entresto. Thanks! Devlin Brink

## 2019-10-11 NOTE — Telephone Encounter (Signed)
Pt notified and stated that she was in the lab at this time.

## 2019-10-12 ENCOUNTER — Ambulatory Visit: Payer: Self-pay | Admitting: Adult Health

## 2019-10-12 ENCOUNTER — Encounter: Payer: Self-pay | Admitting: Adult Health

## 2019-10-12 VITALS — BP 136/81 | HR 92 | Ht 71.0 in | Wt 260.0 lb

## 2019-10-12 DIAGNOSIS — I10 Essential (primary) hypertension: Secondary | ICD-10-CM

## 2019-10-12 DIAGNOSIS — I429 Cardiomyopathy, unspecified: Secondary | ICD-10-CM

## 2019-10-12 DIAGNOSIS — I63411 Cerebral infarction due to embolism of right middle cerebral artery: Secondary | ICD-10-CM

## 2019-10-12 DIAGNOSIS — E0590 Thyrotoxicosis, unspecified without thyrotoxic crisis or storm: Secondary | ICD-10-CM

## 2019-10-12 NOTE — Progress Notes (Signed)
[facility] [address] [place]. [place] [address] [phone]       HOSPITAL FOLLOW UP NOTE  Jeanne [name] Date of Birth:  1965-04-08 Medical Record Number:  [id number]   Reason for Referral:  hospital stroke follow up    SUBJECTIVE:   CHIEF COMPLAINT:  Chief Complaint  Patient presents with  . Hospitalization Follow-up    hosp f/u, states she has been doing well since being home.   . room 9    with wife    HPI:    Personally reviewed recent hospitalization pertinent notes, lab work and imaging with summary provided below JeanneArda Washingtonis a 54 y.o.femalewith history of hx of HTNand hyperthyroidism (tapazole) who developed sudden onset L facial, arm, leg weakness, and dysarthria, presented to [facility] ED on 09/03/2018 where she received IV tPA per Teleneurology and was then transferred to [facility].  Stroke work-up revealed right MCA infarct in setting of right MCA and M4 region occlusion, embolic source likely secondary to newly diagnosed cardiomyopathy with EF 25 to 30%.  Underwent cerebral angiogram without intervention required.  Not recommended for loop recorder placement due to low EF and no insurance but did recommend 30-day cardiac event monitor.  Initiated DAPT for 3 weeks and aspirin alone.  HTN stable.  LDL 51.  A1c 5.6.  Other stroke risk factors include EtOH use and obesity but no prior stroke history.  Other active problems include hypothyroidism with CTA showing marked enlargement right lobe thyroid and recommended thyroid ultrasound outpatient, approximately 2.8 mm x 2.4 mm outpouching in R COM region questionable aneurysm by cerebral angiogram, and severe cervical spine spondylosis.  Evaluated by therapies who recommended outpatient PT and OT although family refused OT and discharged home in stable condition.  Stroke: right MCA infarct - embolic - source likely cardiomyopathy newly  diagnosed  Resultant Left side weakness and numbness  Code Stroke CT Head - Normal head CT. ASPECTS is 10   MRI head-Positive for a 3 cm core infarct in the Right parietal lobe. Cytotoxic edema with punctate hemosiderin and no significant mass effect.Positive also for petechial hemorrhage with mild edema in the Right inferior frontal gyrus.   CTA H&N - High-grade stenosis right MCA bifurcation. Occlusion of the superior division right MCA M2 branch.   CT Perfusion - no core infarct but 143 mL of delayed perfusion in the right MCA territory.   Cerebral angio 09/03/2019. No LVO. Occlusion of possible angular branch of inferior division of the right MCA and M4 region. 2.8 x 2.4 mm outpouching of the right cavernous carotid possibly aneurysm  2D Echo - EF25 to 30%. No cardiac source of emboli identified.   TEE no PFO, EF 30-35%. No SOE.   Will not place Loop given low EF and pending workup, no insurance. 30d monitor planned  [employer] Virus 2 - negative  LDL -51  HgbA1c-5.6  UDS - not collected  No antithromboticprior to admission, now on aspirin 81 mg and plavix daily. Continue DAPT x 3 weeks then aspirin alone.   Therapy recommendations: OP PT (family refused OT)  Disposition: return home   Today, 10/12/2019, [name] is being seen for hospital follow-up accompanied by her significant other.  Recovered fully from stroke standpoint - no residual deficits She is questioning return to work as she has been out of work since 7/1 currently employed by [employer] working in [employer] part-time.  She is currently uninsured due to  part-time position  Completed 3 weeks DAPT and remains on aspirin alone without bleeding or bruising Blood pressure 136/81 - plans on obtaining BP machine to start monitoring at home  Had recent appointment with cardiology who referred for urgent evaluation to endocrinology due to hyperthyroidism.  Does have  evaluation tomorrow through [facility] endocrinology.  Plan on repeat echo with cardiology in 3 months to reassess LVEF with some mention of current hypothyroidism may be contributing  No further concerns at this time    ROS:   14 system review of systems performed and negative with exception of no complaints  PMH:  Past Medical History:  Diagnosis Date  . Allergy   . History of chicken pox   . Hypertension   . Hyperthyroidism   . LV dysfunction   . Morbid obesity (HCC)   . Systolic heart failure (HCC)   . Thyroid enlargement    on imaging    PSH:  Past Surgical History:  Procedure Laterality Date  . ABDOMINAL HYSTERECTOMY  1994   [place]  . BUBBLE STUDY  09/08/2019   Procedure: BUBBLE STUDY;  Surgeon: Sande Rives, MD;  Location: [facility] ENDOSCOPY;  Service: Cardiovascular;;  . IR ANGIO INTRA EXTRACRAN SEL COM CAROTID INNOMINATE BILAT MOD SED  09/03/2019  . IR ANGIO VERTEBRAL SEL SUBCLAVIAN INNOMINATE UNI R MOD SED  09/03/2019  . RADIOLOGY WITH ANESTHESIA N/A 09/03/2019   Procedure: IR WITH ANESTHESIA;  Surgeon: Radiologist, Medication, MD;  Location: [facility] OR;  Service: Radiology;  Laterality: N/A;  . TEE WITHOUT CARDIOVERSION N/A 09/08/2019   Procedure: TRANSESOPHAGEAL ECHOCARDIOGRAM (TEE);  Surgeon: Sande Rives, MD;  Location: [facility] ENDOSCOPY;  Service: Cardiovascular;  Laterality: N/A;    Social History:  Social History   Socioeconomic History  . Marital status: Single    Spouse name: Not on file  . Number of children: Not on file  . Years of education: Not on file  . Highest education level: Not on file  Occupational History  . Not on file  Tobacco Use  . Smoking status: Never Smoker  . Smokeless tobacco: Never Used  Substance and Sexual Activity  . Alcohol use: Yes    Comment: socially  . Drug use: No  . Sexual activity: Not on file  Other Topics Concern  . Not on file  Social History Narrative  . Not on file   Social Determinants of Health    Financial Resource Strain:   . Difficulty of Paying Living Expenses:   Food Insecurity:   . Worried About Programme researcher, broadcasting/film/video in the Last Year:   . Barista in the Last Year:   Transportation Needs:   . Freight forwarder (Medical):   Marland Kitchen Lack of Transportation (Non-Medical):   Physical Activity:   . Days of Exercise per Week:   . Minutes of Exercise per Session:   Stress:   . Feeling of Stress :   Social Connections:   . Frequency of Communication with Friends and Family:   . Frequency of Social Gatherings with Friends and Family:   . Attends Religious Services:   . Active Member of Clubs or Organizations:   . Attends Banker Meetings:   Marland Kitchen Marital Status:   Intimate Partner Violence:   . Fear of Current or Ex-Partner:   . Emotionally Abused:   Marland Kitchen Physically Abused:   . Sexually Abused:     Family History:  Family History  Problem Relation Age of Onset  .  Hypertension Maternal Grandmother   . Diabetes Maternal Grandmother   . Dementia Mother     Medications:   Current Outpatient Medications on File Prior to Visit  Medication Sig Dispense Refill  . albuterol (PROVENTIL HFA;VENTOLIN HFA) 108 (90 Base) MCG/ACT inhaler INHALE TWO PUFFS BY MOUTH EVERY SIX HOURS AS NEEDED    . aspirin EC 81 MG EC tablet Take 1 tablet (81 mg total) by mouth daily. Swallow whole. 30 tablet 11  . carvedilol (COREG) 3.125 MG tablet Take 1 tablet (3.125 mg total) by mouth 2 (two) times daily with a meal. 30 tablet 2  . losartan (COZAAR) 25 MG tablet Take 1 tablet (25 mg total) by mouth daily. 90 tablet 3  . Magnesium Oxide 400 MG CAPS Take 1 capsule (400 mg total) by mouth daily. 90 capsule 3  . methimazole (TAPAZOLE) 10 MG tablet Take 1 tablet (10 mg total) by mouth daily. 30 tablet 1  . potassium chloride SA (KLOR-CON) 20 MEQ tablet Take 2 tablets (40 mEq total) by mouth daily. 180 tablet 3  . sacubitril-valsartan (ENTRESTO) 24-26 MG Take 1 tablet by mouth 2 (two) times  daily. 60 tablet 2   No current facility-administered medications on file prior to visit.    Allergies:  No Known Allergies    OBJECTIVE:  Physical Exam  Vitals:   10/12/19 1502  BP: 136/81  Pulse: 92  Weight: 260 lb (117.9 kg)  Height: 5\' 11"  (1.803 m)   Body mass index is 36.26 kg/m. No exam data present  General: well developed, well nourished,  pleasant middle-age African-American female, seated, in no evident distress Head: head normocephalic and atraumatic.   Neck: supple with no carotid or supraclavicular bruits Cardiovascular: regular rate and rhythm, no murmurs Musculoskeletal: no deformity Skin:  no rash/petichiae Vascular:  Normal pulses all extremities   Neurologic Exam Mental Status: Awake and fully alert.   Fluent speech and language.  Oriented to place and time. Recent and remote memory intact. Attention span, concentration and fund of knowledge appropriate. Mood and affect appropriate.  Cranial Nerves: Fundoscopic exam reveals sharp disc margins. Pupils equal, briskly reactive to light. Extraocular movements full without nystagmus. Visual fields full to confrontation. Hearing intact. Facial sensation intact. Face, tongue, palate moves normally and symmetrically.  Motor: Normal bulk and tone. Normal strength in all tested extremity muscles. Sensory.: intact to touch , pinprick , position and vibratory sensation.  Coordination: Rapid alternating movements normal in all extremities. Finger-to-nose and heel-to-shin performed accurately bilaterally. Gait and Station: Arises from chair without difficulty. Stance is normal. Gait demonstrates normal stride length and balance Reflexes: 1+ and symmetric. Toes downgoing.     NIHSS  0 Modified Rankin  0     ASSESSMENT/PLAN: Jeanne [name] is a 54 y.o. year old female presented with sudden onset left facial, arm, leg weakness and dysarthria on 09/03/2019 with stroke work-up revealing right MCA infarct in setting of  right MCA M2 occlusion s/p TPA and IR without required intervention, embolic source likely secondary to newly diagnosed cardiomyopathy. Vascular risk factors include newly diagnosed cardiomyopathy, HTN, obesity and EtOH use.       1. Right MCA stroke:  a. Residual deficit: Recovered well without residual deficit. b. Advised patient that she is cleared to return to work from a stroke standpoint as she recovered without residual deficit but will need further clearance from cardiology prior to return.  Provided patient with cone financial assistance paperwork c.  Continue aspirin 81 mg daily for secondary  stroke prevention.  d. Close PCP follow up for aggressive stroke risk factor management  2. HTN:  a. BP goal <130/90.   b. Stable today.  c.  Continue f/u with PCP/cardiology 3. Cardiomyopathy, newly diagnosed:  a. Continued follow up with cardiology with plans on repeating 2D echo 3 months from prior imaging 4. Hyperthyroidism:  a. Endocrinology eval tomorrow - per cards, may be contributing to new onset cardiomyopathy and uncontrolled blood pressures    Follow up in 4 months or call earlier if needed   I spent 45 minutes of face-to-face and non-face-to-face time with patient and significant other.  This included previsit chart review, lab review, study review, order entry, electronic health record documentation, patient education regarding recent stroke, discussed recent lab work completed by cardiology and evaluation by endocrinology, importance of managing stroke risk factors and answered all questions to patient satisfaction     Ihor AustinJessica McCue, [facility] LLCGNP-BC  [facility] [address] [place], [place] [id number]  Phone [phone] Fax [phone] Note: This document was prepared with digital dictation and possible smart phrase technology. Any transcriptional errors that result from this process are unintentional.

## 2019-10-12 NOTE — Addendum Note (Signed)
Addended by: Kerney Elbe on: 10/12/2019 02:45 PM   Modules accepted: Orders

## 2019-10-12 NOTE — Patient Instructions (Signed)
Follow up with endocrinology and cardiology  Continue aspirin 81 mg daily for secondary stroke prevention  Continue to follow up with PCP regarding blood pressure management  Maintain strict control of hypertension with blood pressure goal below 130/90, diabetes with hemoglobin A1c goal below 6.5% and cholesterol with LDL cholesterol (bad cholesterol) goal below 70 mg/dL.       Followup in the future with me in 4 months or call earlier if needed       Thank you for coming to see [place] at [facility]. I hope we have been able to provide you high quality care today.  You may receive a patient satisfaction survey over the next few weeks. We would appreciate your feedback and comments so that we may continue to improve ourselves and the health of our patients.

## 2019-10-12 NOTE — Progress Notes (Signed)
I agree with the above plan 

## 2019-10-13 ENCOUNTER — Other Ambulatory Visit: Payer: Self-pay

## 2019-10-13 ENCOUNTER — Ambulatory Visit (HOSPITAL_COMMUNITY): Payer: Self-pay

## 2019-10-13 ENCOUNTER — Other Ambulatory Visit: Payer: Self-pay | Admitting: *Deleted

## 2019-10-13 ENCOUNTER — Encounter: Payer: Self-pay | Admitting: "Endocrinology

## 2019-10-13 ENCOUNTER — Telehealth (HOSPITAL_COMMUNITY): Payer: Self-pay

## 2019-10-13 ENCOUNTER — Ambulatory Visit (INDEPENDENT_AMBULATORY_CARE_PROVIDER_SITE_OTHER): Payer: Self-pay | Admitting: "Endocrinology

## 2019-10-13 VITALS — BP 134/84 | HR 64 | Ht 71.0 in | Wt 260.6 lb

## 2019-10-13 DIAGNOSIS — E0590 Thyrotoxicosis, unspecified without thyrotoxic crisis or storm: Secondary | ICD-10-CM

## 2019-10-13 NOTE — Telephone Encounter (Signed)
Pt will call back to reschedule her consult when she has transportation. AW

## 2019-10-13 NOTE — Patient Outreach (Signed)
[facility]) Care Management  10/13/2019  Jeanne [name] Jan 30, 1966 [id number]   Subjective: Received voicemail message from [facility], states returning call, and requested call back.  Telephone call to patient's home  / mobile number, no answer, left HIPAA compliant voicemail message, and requested call back.     Objective: Per KPN (Knowledge Performance Now, point of care tool) and chart review, patient hospitalized 09/03/2019 -  09/08/2019 for Stroke (cerebrum), Middle cerebral artery embolism, right.   Patient also has a history of hypertension, hyperthyroidism, and Cardiomyopathy.       Assessment: Received Self Pay EMMI Stroke Red Flag follow up referral on 09/28/2019.   Red Flag Alert Trigger, Day # 13, patient answered no to the following question: Went to follow-up appointment?        Plan:RNCM has sent unsuccessful outreach letter, [facility] pamphlet, handout: Know Before You Go, will call patient for 3rd telephone outreach attempt within 7 business days, [facility] EMMI follow up, and proceed with case closure, after 4th unsuccessful outreach call.     Crystina Borrayo H. Gardiner Barefoot, BSN, CCM [facility] Care Management [facility] Telephonic CM Phone: [phone] Fax: [phone]

## 2019-10-13 NOTE — Progress Notes (Signed)
10/13/2019     Endocrinology Consult Note    Subjective:    Patient ID: Jeanne [name], female    DOB: [birth year], PCP Patient, No Pcp Per.   Past Medical History:  Diagnosis Date  . Allergy   . History of chicken pox   . Hypertension   . Hyperthyroidism   . LV dysfunction   . Morbid obesity (HCC)   . Systolic heart failure (HCC)   . Thyroid enlargement    on imaging    Past Surgical History:  Procedure Laterality Date  . ABDOMINAL HYSTERECTOMY  1994   [facility]  . BUBBLE STUDY  09/08/2019   Procedure: BUBBLE STUDY;  Surgeon: Sande Rives, MD;  Location: [facility] ENDOSCOPY;  Service: Cardiovascular;;  . IR ANGIO INTRA EXTRACRAN SEL COM CAROTID INNOMINATE BILAT MOD SED  09/03/2019  . IR ANGIO VERTEBRAL SEL SUBCLAVIAN INNOMINATE UNI R MOD SED  09/03/2019  . RADIOLOGY WITH ANESTHESIA N/A 09/03/2019   Procedure: IR WITH ANESTHESIA;  Surgeon: Radiologist, Medication, MD;  Location: [facility] OR;  Service: Radiology;  Laterality: N/A;  . TEE WITHOUT CARDIOVERSION N/A 09/08/2019   Procedure: TRANSESOPHAGEAL ECHOCARDIOGRAM (TEE);  Surgeon: Sande Rives, MD;  Location: [facility] END[facility]COPY;  Service: Cardiovascular;  Laterality: N/A;    Social History   Socioeconomic History  . Marital status: Single    Spouse name: Not on file  . Number of children: Not on file  . Years of education: Not on file  . Highest education level: Not on file  Occupational History  . Not on file  Tobacco Use  . Smoking status: Never Smoker  . Smokeless tobacco: Never Used  Vaping Use  . Vaping Use: Never used  Substance and Sexual Activity  . Alcohol use: Yes    Comment: socially  . Drug use: No  . Sexual activity: Not on file  Other Topics Concern  . Not on file  Social History Narrative  . Not on file   Social Determinants of Health   Financial Resource Strain:   . Difficulty of Paying Living Expenses:   Food Insecurity:   . Worried About Programme researcher, broadcasting/film/video in the Last Year:   .  Barista in the Last Year:   Transportation Needs:   . Freight forwarder (Medical):   Marland Kitchen Lack of Transportation (Non-Medical):   Physical Activity:   . Days of Exercise per Week:   . Minutes of Exercise per Session:   Stress:   . Feeling of Stress :   Social Connections:   . Frequency of Communication with Friends and Family:   . Frequency of Social Gatherings with Friends and Family:   . Attends Religious Services:   . Active Member of Clubs or Organizations:   . Attends Banker Meetings:   Marland Kitchen Marital Status:     Family History  Problem Relation Age of Onset  . Hypertension Maternal Grandmother   . Diabetes Maternal Grandmother   . Dementia Mother     Outpatient Encounter Medications as of 10/13/2019  Medication Sig  . aspirin EC 81 MG EC tablet Take 1 tablet (81 mg total) by mouth daily. Swallow whole.  . losartan (COZAAR) 25 MG tablet Take 1 tablet (25 mg total) by mouth daily.  . Magnesium Oxide 400 MG CAPS Take 1 capsule (400 mg total) by mouth daily.  . methimazole (TAPAZOLE) 10 MG tablet Take 1 tablet (10 mg total) by mouth daily.  . potassium chloride  SA (KLOR-CON) 20 MEQ tablet Take 2 tablets (40 mEq total) by mouth daily.  . [DISCONTINUED] albuterol (PROVENTIL HFA;VENTOLIN HFA) 108 (90 Base) MCG/ACT inhaler INHALE TWO PUFFS BY MOUTH EVERY SIX HOURS AS NEEDED  . [DISCONTINUED] carvedilol (COREG) 3.125 MG tablet Take 1 tablet (3.125 mg total) by mouth 2 (two) times daily with a meal.  . [DISCONTINUED] sacubitril-valsartan (ENTRESTO) 24-26 MG Take 1 tablet by mouth 2 (two) times daily.   No facility-administered encounter medications on file as of 10/13/2019.    ALLERGIES: No Known Allergies  VACCINATION STATUS:  There is no immunization history on file for this patient.   HPI  Jeanne [name] is 54 y.o. female who presents today with a medical history as above. she is being seen in consultation for hyperthyroidism requested by Ronie Spies,  PA-C. -Patient is a poor historian.  History is obtained by combination of patient interview and chart review. -Based on reviews, she was diagnosed with hypothyroidism in 2015 with uptake and scan showing 51% uptake in 24 hours.  She did not stay with that provider for various reasons.  She apparently various symptoms over the years including weight loss, palpitations, tremors, heat intolerance.  She also developed several complications including cardiomyopathy, heart failure.  More recently, she was found to have high levels of thyroid hormone lab work.  She was restarted on methimazole 10 mg p.o. daily, took it for only few days.    Admittedly, she has been hesitant to do definitive treatment with radioactive iodine treatment which was mentioned to her at one point.  her most recent thyroid labs revealed total T3 of 311 and free T4 of 2.92 on September 27, 2019.  She did not have recent thyroid uptake and scan. -She reports goiter. she denies dysphagia, choking, shortness of breath, no recent voice change.    she denies family history of thyroid dysfunction, or thyroid malignancy.  - she  is hesitantly accepting to proceed with appropriate work up and therapy for thyrotoxicosis.                           Review of systems  Constitutional: + weight loss, + fatigue, + subjective hyperthermia Eyes: no blurry vision, + xerophthalmia ENT: no sore throat, no nodules palpated in throat, no dysphagia/odynophagia, nor hoarseness Cardiovascular: no Chest Pain, no Shortness of Breath, ++  palpitations, no leg swelling Respiratory: no cough, no SOB Gastrointestinal: no Nausea, no Vomiting, no Diarhhea Musculoskeletal: no muscle/joint aches Skin: no rashes Neurological: -  tremors, no numbness, no tingling, no dizziness Psychiatric: no depression, +  anxiety   Objective:    BP 134/84   Pulse 64   Ht 5\' 11"  (1.803 m)   Wt 260 lb 9.6 oz (118.2 kg)   BMI 36.35 kg/m   Wt Readings from Last 3  Encounters:  10/13/19 260 lb 9.6 oz (118.2 kg)  10/12/19 260 lb (117.9 kg)  09/27/19 (!) 264 lb (119.7 kg)                                                Physical exam  Constitutional: Body mass index is 36.35 kg/m., not in acute distress, +stable state of mind Eyes: PERRLA, EOMI, + exophthalmos ENT: moist mucous membranes, +  thyromegaly, no cervical lymphadenopathy Cardiovascular: + normal precordial activity, -tachycardic,  no  Murmur/Rubs/Gallops Respiratory:  adequate breathing efforts, no gross chest deformity, Clear to auscultation bilaterally Gastrointestinal: abdomen soft, Non -tender, No distension, Bowel Sounds present Musculoskeletal: no gross deformities, strength intact in all four extremities Skin: moist, warm, no rashes Neurological: +  tremor with outstretched hands,  + Deep Tendon Reflexes  on both lower extremities.   CMP     Component Value Date/Time   NA 138 10/11/2019 0915   K 3.3 (L) 10/11/2019 0915   CL 102 10/11/2019 0915   CO2 26 10/11/2019 0915   GLUCOSE 118 (H) 10/11/2019 0915   BUN 16 10/11/2019 0915   CREATININE 0.53 10/11/2019 0915   CALCIUM 9.1 10/11/2019 0915   PROT 6.9 09/03/2019 1340   ALBUMIN 3.5 09/03/2019 1340   AST 17 09/03/2019 1340   ALT 15 09/03/2019 1340   ALKPHOS 106 09/03/2019 1340   BILITOT 0.6 09/03/2019 1340   GFRNONAA >60 10/11/2019 0915   GFRAA >60 10/11/2019 0915     CBC    Component Value Date/Time   WBC 6.9 09/08/2019 0435   RBC 5.98 (H) 09/08/2019 0435   HGB 12.2 09/08/2019 0435   HCT 38.9 09/08/2019 0435   PLT 178 09/08/2019 0435   MCV 65.1 (L) 09/08/2019 0435   MCH 20.4 (L) 09/08/2019 0435   MCHC 31.4 09/08/2019 0435   RDW 21.7 (H) 09/08/2019 0435   LYMPHSABS 1.6 09/03/2019 1340   MONOABS 0.9 09/03/2019 1340   EOSABS 0.1 09/03/2019 1340   BASOSABS 0.0 09/03/2019 1340     Diabetic Labs (most recent): Lab Results  Component Value Date   HGBA1C 5.6 09/04/2019    Lipid Panel     Component Value  Date/Time   CHOL 100 09/04/2019 1003   TRIG 31 09/04/2019 1003   HDL 43 09/04/2019 1003   CHOLHDL 2.3 09/04/2019 1003   VLDL 6 09/04/2019 1003   LDLCALC 51 09/04/2019 1003     Lab Results  Component Value Date   TSH <0.010 (L) 09/07/2019   TSH 0.12 (L) 05/11/2014   FREET4 2.92 (H) 09/27/2019   FREET4 2.01 (H) 05/11/2014    Review of her uptake and scan from May 20, 2013 showed 51%  Diffuse activity on the right lobe of the thyroid, no activity on the left lobe.    Assessment & Plan:   1. Hyperthyroidism she is being seen at a kind request of Patient, No Pcp Per. her history and most recent labs are reviewed, and she was examined clinically. Subjective and objective findings are consistent with thyrotoxicosis likely from primary hyperthyroidism. The potential risks of untreated thyrotoxicosis including worsening cardiomyopathy, thyroid storm were discussed in detail with her and her partner, and the need for definitive therapy have been discussed in detail with her, and she agrees to proceed with diagnostic workup and treatment plan.   Options of therapy are discussed with her .  Best choice of therapy for her would be radioactive iodine thyroid ablation.  In preparation,  she will need confirmatory thyroid uptake and scan soon as possible.  She is currently taking methimazole 10 mg daily.  She is advised to hold for 5 days prior to this..  The study will be scheduled to be done as soon as possible in [facility].    -Patient is made aware of the high likelihood of post ablative hypothyroidism with subsequent need for lifelong thyroid hormone replacement. sheunderstands this outcome  and she is  willing to proceed.  Although surgery is one other choice of  treatment in some cases, in her case surgery is not a good fit for presentation with only mild goiter.    In light of asymmetric uptake from 2015 study, if she is found to have cold nodules, she will be considered for  thyroid ultrasound on subsequent visits.    she will return in 10 days for treatment decision.  -Her pulse rate is controlled at 64, did not initiate any new prescriptions for her today.  She is currently on losartan 25 mg p.o. daily.  -Patient is advised to maintain close follow up with her cardiology care, and identify primary care provider.   - Time spent with the patient: 60 minutes, of which >50% was spent in obtaining information about her symptoms, reviewing her previous labs, evaluations, and treatments, counseling her about her primary hyperthyroidism, and developing a plan to confirm the diagnosis and long term treatment as necessary. Please refer to " Patient Self Inventory" in the Media  tab for reviewed elements of pertinent patient history.  [facility] and her partner participated in the discussions, expressed understanding, and voiced agreement with the above plans.  All questions were answered to her satisfaction. she is encouraged to contact clinic should she have any questions or concerns prior to her return visit.   Follow up plan: Return in about 10 days (around 10/23/2019) for F/U with Thyroid Uptake and Scan.   Thank you for involving me in the care of this pleasant patient, and I will continue to update you with her progress.  Marquis LunchGebre Jacion Dismore, MD [facility] Phone: [phone]  Fax: [phone]   10/13/2019, 9:00 AM  This note was partially dictated with voice recognition software. Similar sounding words can be transcribed inadequately or may not  be corrected upon review.

## 2019-10-14 ENCOUNTER — Telehealth: Payer: Self-pay | Admitting: Student

## 2019-10-14 ENCOUNTER — Ambulatory Visit (HOSPITAL_COMMUNITY): Payer: Self-pay

## 2019-10-14 NOTE — Telephone Encounter (Signed)
Pt would like to discuss medications   Please call [phone]   Thanks renee

## 2019-10-14 NOTE — Telephone Encounter (Signed)
Pt calling to discuss methimazole. She was confused and thought this was for BP management. I helped clarify this for her and reviewed her endocrinologist not for her. She was advised to contact them to inquire when she is supposed to discontinue and restart this medication. Her cardiac meds were reviewed. She had no additional questions.

## 2019-10-15 ENCOUNTER — Telehealth: Payer: Self-pay | Admitting: Student

## 2019-10-15 ENCOUNTER — Other Ambulatory Visit (HOSPITAL_COMMUNITY)
Admission: RE | Admit: 2019-10-15 | Discharge: 2019-10-15 | Disposition: A | Discharge status: Home / Self Care | Payer: Self-pay | Source: Ambulatory Visit | Attending: Physician Assistant | Admitting: Physician Assistant

## 2019-10-15 ENCOUNTER — Other Ambulatory Visit: Payer: Self-pay

## 2019-10-15 DIAGNOSIS — Z79899 Other long term (current) drug therapy: Secondary | ICD-10-CM | POA: Insufficient documentation

## 2019-10-15 LAB — BASIC METABOLIC PANEL
Anion gap: 9 (ref 5–15)
BUN: 11 mg/dL (ref 6–20)
CO2: 25 mmol/L (ref 22–32)
Calcium: 9.4 mg/dL (ref 8.9–10.3)
Chloride: 104 mmol/L (ref 98–111)
Creatinine, Ser: 0.48 mg/dL (ref 0.44–1.00)
GFR calc Af Amer: >60 mL/min (ref >60)
GFR calc non Af Amer: >60 mL/min (ref >60)
Glucose, Bld: 101 mg/dL — ABNORMAL HIGH (ref 70–99)
Potassium: 4.2 mmol/L (ref 3.5–5.1)
Sodium: 138 mmol/L (ref 135–145)

## 2019-10-15 LAB — MAGNESIUM: Magnesium: 1.9 mg/dL (ref 1.7–2.4)

## 2019-10-15 NOTE — Telephone Encounter (Signed)
Please have patient go to urgent care for in-person evaluation Thank you Ronie Spies PA-C

## 2019-10-15 NOTE — Telephone Encounter (Signed)
Advised patient to go to urgent care for in-person evaluation.   Patient verbalized understanding

## 2019-10-15 NOTE — Telephone Encounter (Signed)
Pt called to report symptoms as instructed after medication changes. Pt notes some face swelling.    Please call if needed [phone] (if you miss Pt she is coming in for labs today)   Thanks renee

## 2019-10-15 NOTE — Telephone Encounter (Signed)
Returned patients call. She reports starting losartan, mag oxide, methimazole, postassium chloride on 10/11/19. On 10/14/19 a friend noticed mid morning that the patients right eye area looked swollen. Around ~2pm it seemed to be getting better with just minimal swelling. Patient notes that her weight is the same and she has no other S&S.   Advised patient if swelling gets worse to call back. If she has swelling around her mouth, lips, tongue and/or throat with or without difficultly breathing to seek immediate help.

## 2019-10-18 ENCOUNTER — Telehealth: Payer: Self-pay

## 2019-10-18 ENCOUNTER — Ambulatory Visit: Payer: Self-pay | Admitting: *Deleted

## 2019-10-18 ENCOUNTER — Other Ambulatory Visit: Payer: Self-pay | Admitting: *Deleted

## 2019-10-18 ENCOUNTER — Encounter: Payer: Self-pay | Admitting: *Deleted

## 2019-10-18 ENCOUNTER — Other Ambulatory Visit (HOSPITAL_COMMUNITY): Payer: Self-pay

## 2019-10-18 DIAGNOSIS — Z79899 Other long term (current) drug therapy: Secondary | ICD-10-CM

## 2019-10-18 MED ORDER — POTASSIUM CHLORIDE CRYS ER 20 MEQ PO TBCR: 20.0000 meq | EXTENDED_RELEASE_TABLET | Freq: Every day | ORAL | 3 refills | Status: AC

## 2019-10-18 NOTE — Telephone Encounter (Signed)
I poke with patient and discussed her lab results.She will decrease Potassium to 20 meq daily and repeat K+ in 10 days, lab slip mailed.

## 2019-10-18 NOTE — Patient Outreach (Addendum)
[facility]) Care Management  [facility] Care Manager  10/18/2019   Jeanne [name] 09-15-1965 [id number]  Subjective: Received voicemail message from [facility], states she is returning call, and requested call back.    Telephone call to patient's home / mobile number, spoke with patient, and HIPAA verified.  Discussed [facility] Care Management Self pay EMMI Stroke Red Flag Alert follow up, patient voiced understanding, and is in agreement to follow up.    Patient states she remember receiving EMMI automated calls and she has attended the following appointments, all appointments went well: on 10/11/2019 with cardiologist, on 10/12/2019 with neurologist, on 10/13/2019 with endocrinologist.    States she is will have radiologist appointment on 10/21/2019 and will have another endocrinologist appointment on 10/25/2019 to discuss results of upcoming scan related to enlarged thyroid.   Patient states she is able to manage self care and has assistance as needed.  Patient voices understanding of medical diagnosis and treatment plan. Discussed importance of hospital follow up with primary MD, patient voices understanding, and states she will follow up as appropriate, working on establishing care with a primary MD in [place], [place].    States her blood pressure has been good when taken at her provider appointments since hospital discharge and is planning to get her own blood pressure monitor.  Patient states she is currently not able to work due to stroke, has not been released to return to work, current job has no benefits, struggling financially, and family/ friends are assisting as they can but have their bills to pay.  Patient states she needs financial, health insurance community resources, is in agreement to a referral to [facility] Care Management Social Worker for Advertising copywriter. Patient states she does not have any education material, EMMI follow up, care coordination, care management, disease  monitoring, transportation, or pharmacy needs at this time.   States she is very appreciative of the follow up, is in agreement  to receive 1 additional follow up call to assess for further CM needs, and is in agreement to receive [facility] Care Management EMMI follow up calls as needed.     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 Patient also has a history of hypertension, hyperthyroidism, and Cardiomyopathy.     Encounter Medications:  Outpatient Encounter Medications as of 10/18/2019  Medication Sig Note  . aspirin EC 81 MG EC tablet Take 1 tablet (81 mg total) by mouth daily. Swallow whole.   . losartan (COZAAR) 25 MG tablet Take 1 tablet (25 mg total) by mouth daily.   . Magnesium Oxide 400 MG CAPS Take 1 capsule (400 mg total) by mouth daily.   . potassium chloride SA (KLOR-CON) 20 MEQ tablet Take 2 tablets (40 mEq total) by mouth daily.   . methimazole (TAPAZOLE) 10 MG tablet Take 1 tablet (10 mg total) by mouth daily. (Patient not taking: Reported on 10/18/2019) 10/18/2019: States not taking per MD order due to upcoming scan.    No facility-administered encounter medications on file as of 10/18/2019.    Functional Status:  No flowsheet data found.  Fall/Depression Screening: No flowsheet data found. PHQ 2/9 Scores 10/18/2019  PHQ - 2 Score 0    Assessment: Received Self Pay EMMI Stroke Red Flag follow up referral on 09/28/2019. Red Flag Alert Trigger, Day # 13, patient answered no to the following question: Went to follow-up appointment?  EMMI follow up  completed and will follow up to assess further care management needs.      Plan:  RNCM will refer patient to [facility] Care Management Social Worker for financial and Engineer, production. RNCM will call patient for telephone outreach attempt, within 21  business days, EMMI follow up, to assess for further CM needs, and proceed with case closure, after 4th unsuccessful outreach call.        Javion Holmer H. Gardiner Barefoot, BSN, CCM [facility] Care Management [facility] Telephonic CM Phone: [phone] Fax: [phone]

## 2019-10-18 NOTE — Telephone Encounter (Signed)
-----   Message from Laurann Montana, [place] sent at 10/15/2019  1:09 PM EDT ----- Please let pt know labs look improved. Now that she is off the [place], we may not need to keep the potassium at the current value so would decrease KCl to daily. Recheck potassium level in 1-2 weeks at patient's convenience, doesn't have to be fasting. Doesn't need the whole BMET, just potassium level. Thanks

## 2019-10-18 NOTE — Telephone Encounter (Signed)
-----   Message from Dayna N Dunn, PA-C sent at 10/15/2019  1:09 PM EDT ----- Please let pt know labs look improved. Now that she is off the [place]resto, we may not need to keep the potassium at the current value so would decrease KCl to 20meq daily. Recheck potassium level in 1-2 weeks at patient's convenience, doesn't have to be fasting. Doesn't need the whole BMET, just potassium level. Thanks 

## 2019-10-19 ENCOUNTER — Other Ambulatory Visit: Payer: Self-pay | Admitting: *Deleted

## 2019-10-19 ENCOUNTER — Encounter: Payer: Self-pay | Admitting: *Deleted

## 2019-10-19 NOTE — Patient Outreach (Signed)
[facility]) Care Management  10/19/2019  Dustin Burrill 04-27-1965 [id number]  CSW was able to make initial contact with patient today to perform phone assessment, as well as assess and assist with social work needs and services.  CSW introduced self, explained role and types of services provided through [employer] Care Management [facility] Care Management).  CSW further explained to patient that CSW works with patient's Telephonic RNCM, also with [facility] Care Management, Elmer Picker.  CSW then explained the reason for the call, indicating that Mrs. Excell Seltzer thought that patient would benefit from social work services and resources to assist with [employer], as well as providing patient with a list of community agencies and resources that offer financial assistance.  CSW obtained two HIPAA compliant identifiers from patient, which included patient's name and date of birth.  Patient admitted to CSW that she is currently unable to work due to her recent Stroke, but that she has no health insurance benefits through her employer.  Patient went on to explain that she is employed with the [employer], but that she does not earn income unless she is "putting in hours".  Patient indicated that she is struggling financially, relying on family members and friends to help pay her rent, utilities, medications, groceries, etc.  Patient stated, "And now I have to worry about being able to pay my medical bills so that I can continue to receive healthcare".  CSW first spoke with patient about Vocational Rehabilitation and Employment Services, providing patient with the name of a counselor that assists with job placement.  CSW encouraged patient to consider finding a job that offers benefits, unless patient is willing to apply for Adult Medicaid.    CSW offered to assist patient with completion of an Adult Medicaid application, if she thinks she would be eligible,  agreeing to mail patient an application for her review and consideration.  CSW then agreed to put patient in contact with a representative from the Finance Department at [employer], as well as mail patient an application for [facility].  CSW spoke with patient about getting established with a primary care provider, at the [facility], ensuring that she had the correct contact information.  CSW also offered to mail patient a complete list of community agencies and resources that may be able to offer financial assistance with food, rent and utilities, which included all of the following:    Medicaid Tips Medicaid Application [address] Pantries [employer] Book - [facility] in [place] The [employer] Book - Armed forces logistics/support/administrative officer, Economist in [place] Food Oncologist Section 8 Housing Application [place] [employer] ( Housing Opportunities + Prevention of Eviction) Landscape architect; Administrator, Civil Service; Estate agent for [employer]; Application; FAQ's for [employer] [facility] Financial Assistance Application Emergency Assistance Programs Financial Assistance Resources [facility] - Vocational Rehabilitation Services [facility] - Client Assistance Program  [facility] - Employment Services for People with Disabilities   Patient was very appreciative of the assistance and resources, indicating that she is already making calls on her own behalf to try and obtain financial assistance.  CSW agreed to assist patient with application completion, if necessary, as well as write letters of necessity on patient's behalf, if required.  CSW will follow-up with patient again next week, on Wednesday, October 27, 2019, around 9:00am, to ensure that she received the packet of resource information mailed to her home by  CSW today.  CSW was able to confirm that patient has the correct contact information for CSW,  encouraging patient to contact CSW directly if additional social work needs arise in the meantime.  Danford Bad, BSW, MSW, LCSW  Licensed Restaurant manager, fast food [facility]  Mailing [place] [address], [place], [place] [address] Physical Address-[address], [place], [place] [address] Toll Free Main # [phone] Fax # [phone] Cell # [phone]  Office # [phone] [email or web address]

## 2019-10-21 ENCOUNTER — Ambulatory Visit: Payer: Self-pay | Admitting: *Deleted

## 2019-10-21 ENCOUNTER — Encounter (HOSPITAL_COMMUNITY): Payer: Self-pay

## 2019-10-21 ENCOUNTER — Encounter (HOSPITAL_COMMUNITY)
Admission: RE | Admit: 2019-10-21 | Discharge: 2019-10-21 | Disposition: A | Discharge status: Home / Self Care | Payer: Self-pay | Source: Ambulatory Visit | Attending: "Endocrinology | Admitting: "Endocrinology

## 2019-10-21 ENCOUNTER — Other Ambulatory Visit: Payer: Self-pay

## 2019-10-21 DIAGNOSIS — E0590 Thyrotoxicosis, unspecified without thyrotoxic crisis or storm: Secondary | ICD-10-CM | POA: Insufficient documentation

## 2019-10-21 MED ORDER — SODIUM IODIDE I-123 7.4 MBQ CAPS
454.0000 | ORAL_CAPSULE | Freq: Once | ORAL | Status: AC
  Administered 2019-10-21: 454 via ORAL

## 2019-10-22 ENCOUNTER — Encounter (HOSPITAL_COMMUNITY)
Admission: RE | Admit: 2019-10-22 | Discharge: 2019-10-22 | Disposition: A | Discharge status: Home / Self Care | Payer: Self-pay | Source: Ambulatory Visit | Attending: "Endocrinology | Admitting: "Endocrinology

## 2019-10-25 ENCOUNTER — Ambulatory Visit (INDEPENDENT_AMBULATORY_CARE_PROVIDER_SITE_OTHER): Payer: Self-pay | Admitting: "Endocrinology

## 2019-10-25 ENCOUNTER — Other Ambulatory Visit: Payer: Self-pay

## 2019-10-25 ENCOUNTER — Encounter: Payer: Self-pay | Admitting: "Endocrinology

## 2019-10-25 VITALS — BP 110/64 | HR 68 | Ht 71.0 in | Wt 256.5 lb

## 2019-10-25 DIAGNOSIS — E0520 Thyrotoxicosis with toxic multinodular goiter without thyrotoxic crisis or storm: Secondary | ICD-10-CM

## 2019-10-25 NOTE — Progress Notes (Signed)
10/25/2019      Endocrinology follow-up note    Subjective:    Patient ID: Jeanne [name], female    DOB: September 25, 1965, PCP Patient, No Pcp Per.   Past Medical History:  Diagnosis Date  . Allergy   . History of chicken pox   . Hypertension   . Hyperthyroidism   . LV dysfunction   . Morbid obesity (HCC)   . Systolic heart failure (HCC)   . Thyroid enlargement    on imaging    Past Surgical History:  Procedure Laterality Date  . ABDOMINAL HYSTERECTOMY  1994   [place]  . BUBBLE STUDY  09/08/2019   Procedure: BUBBLE STUDY;  Surgeon: Sande Rives, MD;  Location: [facility] ENDOSCOPY;  Service: Cardiovascular;;  . IR ANGIO INTRA EXTRACRAN SEL COM CAROTID INNOMINATE BILAT MOD SED  09/03/2019  . IR ANGIO VERTEBRAL SEL SUBCLAVIAN INNOMINATE UNI R MOD SED  09/03/2019  . RADIOLOGY WITH ANESTHESIA N/A 09/03/2019   Procedure: IR WITH ANESTHESIA;  Surgeon: Radiologist, Medication, MD;  Location: [facility] OR;  Service: Radiology;  Laterality: N/A;  . TEE WITHOUT CARDIOVERSION N/A 09/08/2019   Procedure: TRANSESOPHAGEAL ECHOCARDIOGRAM (TEE);  Surgeon: Sande Rives, MD;  Location: [facility] ENDOSCOPY;  Service: Cardiovascular;  Laterality: N/A;    Social History   Socioeconomic History  . Marital status: Single    Spouse name: Not on file  . Number of children: Not on file  . Years of education: 56  . Highest education level: 12th grade  Occupational History  . Occupation: Unable to work due to recent stroke.  Tobacco Use  . Smoking status: Never Smoker  . Smokeless tobacco: Never Used  Vaping Use  . Vaping Use: Never used  Substance and Sexual Activity  . Alcohol use: Yes    Comment: socially  . Drug use: No  . Sexual activity: Yes  Other Topics Concern  . Not on file  Social History Narrative  . Not on file   Social Determinants of Health   Financial Resource Strain: High Risk  . Difficulty of Paying Living Expenses: Very hard  Food Insecurity: No Food Insecurity  .  Worried About Programme researcher, broadcasting/film/video in the Last Year: Never true  . Ran Out of Food in the Last Year: Never true  Transportation Needs: No Transportation Needs  . Lack of Transportation (Medical): No  . Lack of Transportation (Non-Medical): No  Physical Activity: Inactive  . Days of Exercise per Week: 0 days  . Minutes of Exercise per Session: 0 min  Stress: Stress Concern Present  . Feeling of Stress : Very much  Social Connections: Moderately Isolated  . Frequency of Communication with Friends and Family: More than three times a week  . Frequency of Social Gatherings with Friends and Family: More than three times a week  . Attends Religious Services: More than 4 times per year  . Active Member of Clubs or Organizations: No  . Attends Banker Meetings: Never  . Marital Status: Separated    Family History  Problem Relation Age of Onset  . Hypertension Maternal Grandmother   . Diabetes Maternal Grandmother   . Dementia Mother     Outpatient Encounter Medications as of 10/25/2019  Medication Sig  . aspirin EC 81 MG EC tablet Take 1 tablet (81 mg total) by mouth daily. Swallow whole.  . losartan (COZAAR) 25 MG tablet Take 1 tablet (25 mg total) by mouth daily.  . Magnesium Oxide 400 MG  CAPS Take 1 capsule (400 mg total) by mouth daily.  . methimazole (TAPAZOLE) 10 MG tablet Take 1 tablet (10 mg total) by mouth daily. (Patient not taking: Reported on 10/18/2019)  . potassium chloride SA (KLOR-CON) 20 MEQ tablet Take 1 tablet (20 mEq total) by mouth daily.   No facility-administered encounter medications on file as of 10/25/2019.    ALLERGIES: No Known Allergies  VACCINATION STATUS:  There is no immunization history on file for this patient.   HPI  Jeanne [name] is 54 y.o. female who presents today with a medical history as above. she is being seen in follow-up after she was seen in consultation for hyperthyroidism requested by Ronie Spies, PA-C. -Patient is a  poor historian.  History is obtained by combination of patient interview and chart review. -Based on reviews, she was diagnosed with hyperthyroidism in 2015 with uptake and scan showing 51% uptake in 24 hours.  She did not stay with that provider for various reasons.  She apparently various symptoms over the years including weight loss, palpitations, tremors, heat intolerance.  She also developed several complications including cardiomyopathy, heart failure.  More recently, she was found to have high levels of thyroid hormone lab work.  She was restarted on methimazole 10 mg p.o. daily, took it for only few days.   During her last visit, methemoglobin was held for 5 days and repeat uptake and scan was performed which was significant for 25% uptake in 4 hours, and 20% in 24 hours.  Uptake also showed large asymmetric multinodular goiter right greater than left.  Patient denies dysphagia, shortness of breath, no voice change.  Admittedly, she has been hesitant to do definitive treatment with radioactive iodine treatment which was mentioned to her at one point.  her most recent thyroid labs revealed total T3 of 311 and free T4 of 2.92 on September 27, 2019.  She did not have recent thyroid uptake and scan. -She reports goiter.   she denies family history of thyroid dysfunction, or thyroid malignancy.  - she  is hesitantly accepting to proceed with appropriate work up and therapy for thyrotoxicosis.                           Review of systems  Constitutional: + weight loss, + fatigue, + subjective hyperthermia Eyes: no blurry vision, + xerophthalmia ENT: no sore throat, no nodules palpated in throat, no dysphagia/odynophagia, nor hoarseness Cardiovascular: no Chest Pain, no Shortness of Breath, -  palpitations, no leg swelling Respiratory: no cough, no SOB Gastrointestinal: no Nausea, no Vomiting, no Diarhhea Musculoskeletal: no muscle/joint aches Skin: no rashes Neurological: -  tremors, no numbness,  no tingling, no dizziness Psychiatric: no depression, +  anxiety   Objective:    BP 110/64   Pulse 68   Ht 5\' 11"  (1.803 m)   Wt 256 lb 8 oz (116.3 kg)   BMI 35.77 kg/m   Wt Readings from Last 3 Encounters:  10/25/19 256 lb 8 oz (116.3 kg)  10/13/19 260 lb 9.6 oz (118.2 kg)  10/12/19 260 lb (117.9 kg)                                                Physical exam  Constitutional: Body mass index is 35.77 kg/m., not in acute distress, +stable state of mind  Eyes: PERRLA, EOMI, + exophthalmos ENT: moist mucous membranes, +  thyromegaly, no cervical lymphadenopathy Cardiovascular: + normal precordial activity, -tachycardic,  no Murmur/Rubs/Gallops Respiratory:  adequate breathing efforts, no gross chest deformity, Clear to auscultation bilaterally Gastrointestinal: abdomen soft, Non -tender, No distension, Bowel Sounds present Musculoskeletal: no gross deformities, strength intact in all four extremities Skin: moist, warm, no rashes Neurological: +  tremor with outstretched hands,  + Deep Tendon Reflexes  on both lower extremities.   CMP     Component Value Date/Time   NA 138 10/15/2019 1018   K 4.2 10/15/2019 1018   CL 104 10/15/2019 1018   CO2 25 10/15/2019 1018   GLUCOSE 101 (H) 10/15/2019 1018   BUN 11 10/15/2019 1018   CREATININE 0.48 10/15/2019 1018   CALCIUM 9.4 10/15/2019 1018   PROT 6.9 09/03/2019 1340   ALBUMIN 3.5 09/03/2019 1340   AST 17 09/03/2019 1340   ALT 15 09/03/2019 1340   ALKPHOS 106 09/03/2019 1340   BILITOT 0.6 09/03/2019 1340   GFRNONAA >60 10/15/2019 1018   GFRAA >60 10/15/2019 1018     CBC    Component Value Date/Time   WBC 6.9 09/08/2019 0435   RBC 5.98 (H) 09/08/2019 0435   HGB 12.2 09/08/2019 0435   HCT 38.9 09/08/2019 0435   PLT 178 09/08/2019 0435   MCV 65.1 (L) 09/08/2019 0435   MCH 20.4 (L) 09/08/2019 0435   MCHC 31.4 09/08/2019 0435   RDW 21.7 (H) 09/08/2019 0435   LYMPHSABS 1.6 09/03/2019 1340   MONOABS 0.9 09/03/2019 1340    EOSABS 0.1 09/03/2019 1340   BASOSABS 0.0 09/03/2019 1340     Diabetic Labs (most recent): Lab Results  Component Value Date   HGBA1C 5.6 09/04/2019    Lipid Panel     Component Value Date/Time   CHOL 100 09/04/2019 1003   TRIG 31 09/04/2019 1003   HDL 43 09/04/2019 1003   CHOLHDL 2.3 09/04/2019 1003   VLDL 6 09/04/2019 1003   LDLCALC 51 09/04/2019 1003     Lab Results  Component Value Date   TSH <0.010 (L) 09/07/2019   TSH 0.12 (L) 05/11/2014   FREET4 2.92 (H) 09/27/2019   FREET4 2.01 (H) 05/11/2014    Review of her uptake and scan from May 20, 2013 showed 51%  Diffuse activity on the right lobe of the thyroid, no activity on the left lobe.  Repeat thyroid uptake and scan on October 22, 2019 FINDINGS: Inhomogeneous increased tracer uptake throughout enlarged RIGHT thyroid lobe.  Slightly enlarged area of decreased uptake in the midportion of the RIGHT lobe versus prior.  Suppression of uptake within LEFT lobe, only faintly visible.  4 hour I-123 uptake = 25% (normal 5-20%),   24 hour I-123 uptake = 20% (normal 10-30%)  IMPRESSION: Elevated 4 hour and normal 24 hour radio iodine uptakes.  Diffusely increased tracer uptake throughout an enlarged heterogeneous RIGHT thyroid lobe.  Patient has a markedly enlarged multinodular appearing RIGHT thyroid lobe on recent CTA neck with normal size and appearance of the LEFT lobe.  Please note that the patient received iodinated contrast material on 09/03/2019; the iodine load from contrast has likely cleared in the nearly 7 weeks since administration and is probably not significantly affecting radio iodine uptake at present.   Assessment & Plan:   1. Hyperthyroidism-toxic multinodular goiter  her history and most recent labs are reviewed, and she was examined clinically. Subjective and objective findings are consistent with thyrotoxicosis likely from primary hyperthyroidism.  The potential risks of  untreated thyrotoxicosis including worsening cardiomyopathy, thyroid storm were discussed in detail with her and her partner, and the need for definitive therapy have been discussed in detail with her, and she agrees to proceed with diagnostic workup and treatment plan.  -Based on the findings of her most recent thyroid uptake and scan, she will need baseline thyroid ultrasound for better anatomic study of the thyroid.  She will be considered for fine-needle aspiration if suspicious nodules are identified.  These findings are favorable, she will be considered for I-131 thyroid ablation.  This ultrasound will be scheduled to be diagnosis possible.  She will stay off of methimazole and return in 1 week for reevaluation.  -Patient is made aware of the high likelihood of post ablative hypothyroidism with subsequent need for lifelong thyroid hormone replacement. sheunderstands this outcome  and she is  willing to proceed.  Although surgery is one other choice of treatment in some cases, in her case surgery is not a good fit for presentation with only mild goiter.     she will return in 7 days for treatment decision.  -Her pulse rate is controlled at 68, did not initiate any new prescriptions for her today.  She is currently on losartan 25 mg p.o. daily.  -Patient is advised to maintain close follow up with her cardiology care, and identify primary care provider.      - Time spent on this patient care encounter:  20 minutes of which 50% was spent in  counseling and the rest reviewing  her current and  previous labs / studies and medications  doses and developing a plan for long term care. [facility] Litzenberger  participated in the discussions, expressed understanding, and voiced agreement with the above plans.  All questions were answered to her satisfaction. she is encouraged to contact clinic should she have any questions or concerns prior to her return visit.   Follow up plan: Return in about 1 week  (around 11/01/2019) for Thyroid / Neck Ultrasound.   Thank you for involving me in the care of this pleasant patient, and I will continue to update you with her progress.  Marquis LunchGebre Meaghen Vecchiarelli, MD [facility] Phone: [phone]  Fax: [phone]   10/25/2019, 12:43 PM  This note was partially dictated with voice recognition software. Similar sounding words can be transcribed inadequately or may not  be corrected upon review.

## 2019-10-26 ENCOUNTER — Other Ambulatory Visit: Payer: Self-pay | Admitting: *Deleted

## 2019-10-26 NOTE — Patient Outreach (Signed)
[facility]) Care Management  10/26/2019  Jeanne [name] Dec 18, 1965 [id number]   Subjective: Telephone call to patient's home / mobile number times 3, spoke with patient, and HIPAA verified.  Patient states she is doing great, had follow up appointment with endocrinologist on 10/25/2019, received scan results, and ultrasound of thyroid pending.   States the scan showed that her thyroid was very enlarged, very active, and ultrasound will be ordered to verify if it is cancerous.   States treatment plan/ next steps to be determined based on ultrasound results.   States she is working with Child psychotherapist regarding community resources and following up as needed.  States she has not had any luck finding a primary MD. RNCM verbally gave patient contact information for [facility] [phone], [address] [place], [place], [address]), voices understanding, is in agreement, and states she will call today to follow up.  States she is also working with someone to obtain health insurance.   Patient states she does not have any education material, EMMI follow up, transportation, or pharmacy needs at this time.  States she is very appreciative of the follow up, is in agreement  to receive 1 additional follow up call to assess for further CM needs, and is in agreement to receive [facility] Care Management EMMI follow up calls as needed.    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 Patient also has a history of hypertension, hyperthyroidism, and Cardiomyopathy.     Assessment: Received Self Pay EMMI Stroke Red Flag follow up referral on 09/28/2019. Red Flag Alert Trigger, Day # 13, patient answered no to the following question: Went to follow-up appointment?  EMMI follow up completed and will follow up to assess further care management  needs.      Plan:  RNCM will call patient for telephone outreach attempt, within 21 business days, EMMI follow up, to assess for further CM needs, and proceed with case closure, after 4th unsuccessful outreach call.       Aymen Widrig H. Gardiner Barefoot, BSN, CCM [facility] Care Management [facility] Telephonic CM Phone: [phone] Fax: [phone]

## 2019-10-27 ENCOUNTER — Encounter: Payer: Self-pay | Admitting: *Deleted

## 2019-10-27 ENCOUNTER — Other Ambulatory Visit: Payer: Self-pay | Admitting: *Deleted

## 2019-10-27 ENCOUNTER — Other Ambulatory Visit (HOSPITAL_COMMUNITY)
Admission: RE | Admit: 2019-10-27 | Discharge: 2019-10-27 | Disposition: A | Discharge status: Home / Self Care | Payer: Self-pay | Source: Ambulatory Visit | Attending: Physician Assistant | Admitting: Physician Assistant

## 2019-10-27 ENCOUNTER — Other Ambulatory Visit: Payer: Self-pay

## 2019-10-27 DIAGNOSIS — Z79899 Other long term (current) drug therapy: Secondary | ICD-10-CM | POA: Insufficient documentation

## 2019-10-27 LAB — POTASSIUM: Potassium: 3.9 mmol/L (ref 3.5–5.1)

## 2019-10-27 NOTE — Patient Outreach (Signed)
[facility]) Care Management  10/27/2019  Jeanne [name] 14-Mar-1965 [id number]  CSW was able to make contact with patient today to follow-up regarding social work services, as well as to ensure that patient received the large packet of resource information and applications that CSW mailed to her home last week, on Tuesday, October 19, 2019.  Patient confirmed receipt, indicating that she has already made a great deal of progress with regards to applying for financial assistance, proceeding to share with CSW every agency that she has contacted and every application she has submitted.  Patient reported that she was at the bank at the time of CSW's call, picking up copies of her last two bank statements, required as "proof of income" on most financial assistance applications.  Patient explained that she hopes to be able to submit her applications for Adult Medicaid and Food Stamps, to the [facility], by Friday, October 29, 2019, for processing.  Patient is aware that it can take 45-60 days to process the applications to determine eligibility, and that she may be eligible for one program but not the other.  Patient indicated that she planned to stop by her employer, the [place] office, to obtain pay stubs, when she leaves the bank.  Since patient was already in [place], she verbalized that she will be stopping at several [employer], Marketing executive and [employer], to obtain food, prior to returning to [place], [place].  Patient reported that she has contacted the following agencies, as well as submitted the following applications, to request financial assistance to help pay for rent, utilities and low-income housing:  Section 8 Housing Application; Associate Professor ( Housing Opportunities + Prevention of Eviction) Land; Education administrator; Industrial/product designer) Application and [place]  (Long) Agricultural engineer) Application.  Patient further reported that she has also contacted several agencies from the lists of Financial Assistance Programs/Resources that CSW provided to her, already approved for financial assistance.    Last, patient realizes the importance of having health insurance coverage, which is not a benefit offered at her current place of employment, wanting to seek part-time employment elsewhere.  CSW reminded patient to follow-up with Vocational Rehabilitation Services, as well as Health and safety inspector for [employer] with Disabilities, both with the [place], to inquire about job opportunities, job placement and possible temporary job agency work.  Patient admitted that she has made looking for a new job a major priority, agreeing to contact a representative at [employer] to inquire about opportunities.  CSW commended patient for all the progress she has made and the fact that she has really taken the initiative.    CSW will perform a case closure on patient, as all goals of treatment have been met from social work standpoint and no additional social work needs have been identified at this time.  CSW will notify patient's Telephonic RNCM with [facility], Jeanne [name] of CSW's plans to close patient's case.  CSW will fax an update to Jeanne [name], Cardiology Physician Assistant Certified, and Jeanne [name], Cardiology Nurse Practitioner, both with [facility], as patient does not currently has a primary care physician, but indicated that she is diligently working on it.  CSW confirmed that patient has the correct contact information for CSW.    Nat Christen, BSW, MSW, [place]  Licensed Education officer, environmental Health  System  Mailing Address-[address], [place], [place] [address] Physical Address-[address], [place], [place]  [address] Toll Free Main # [phone] Fax # [phone] Cell # [phone]  [email or web address]

## 2019-10-28 ENCOUNTER — Telehealth: Payer: Self-pay | Admitting: "Endocrinology

## 2019-10-28 NOTE — Telephone Encounter (Signed)
Patient complaining of ears being plugged, hearing is muffled, pt not sure if it is related to her thyroid issues she is having. Please advise.

## 2019-10-28 NOTE — Telephone Encounter (Signed)
Not related to her thyroid. If continues after 1-2 days , she has to see PCP.

## 2019-10-28 NOTE — Telephone Encounter (Signed)
Returned call to patient and had to leave voice mail message. Advised of the instructions.

## 2019-10-28 NOTE — Telephone Encounter (Signed)
Pt requesting call back from nurse. [phone]

## 2019-10-30 ENCOUNTER — Other Ambulatory Visit: Payer: Self-pay

## 2019-10-30 ENCOUNTER — Encounter (HOSPITAL_COMMUNITY): Payer: Self-pay

## 2019-10-30 ENCOUNTER — Emergency Department (HOSPITAL_COMMUNITY)
Admission: EM | Admit: 2019-10-30 | Discharge: 2019-10-30 | Disposition: A | Discharge status: Home / Self Care | Payer: Self-pay | Attending: Emergency Medicine | Admitting: Emergency Medicine

## 2019-10-30 DIAGNOSIS — I110 Hypertensive heart disease with heart failure: Secondary | ICD-10-CM | POA: Insufficient documentation

## 2019-10-30 DIAGNOSIS — Z79899 Other long term (current) drug therapy: Secondary | ICD-10-CM | POA: Insufficient documentation

## 2019-10-30 DIAGNOSIS — H9203 Otalgia, bilateral: Secondary | ICD-10-CM | POA: Insufficient documentation

## 2019-10-30 DIAGNOSIS — H81399 Other peripheral vertigo, unspecified ear: Secondary | ICD-10-CM

## 2019-10-30 DIAGNOSIS — R42 Dizziness and giddiness: Secondary | ICD-10-CM | POA: Insufficient documentation

## 2019-10-30 DIAGNOSIS — Z7982 Long term (current) use of aspirin: Secondary | ICD-10-CM | POA: Insufficient documentation

## 2019-10-30 DIAGNOSIS — I5020 Unspecified systolic (congestive) heart failure: Secondary | ICD-10-CM | POA: Insufficient documentation

## 2019-10-30 MED ORDER — MECLIZINE HCL 25 MG PO TABS
25.0000 mg | ORAL_TABLET | Freq: Three times a day (TID) | ORAL | 0 refills | Status: DC | PRN
Start: 2019-10-30 — End: 2019-11-25

## 2019-10-30 NOTE — ED Triage Notes (Addendum)
Pt woke up and was extremely dizzy. She has been having trouble with her ears stopping up this morning. History of vertigo many years ago. History of a stroke on September 02, 2019.

## 2019-10-30 NOTE — Discharge Instructions (Signed)
Return if any new symptoms develop with the vertigo.  Follow-up with ENT.

## 2019-10-30 NOTE — ED Provider Notes (Signed)
[facility] EMERGENCY DEPARTMENT Provider Note   CSN: [id number] Arrival date & time: 10/30/19  9826     History Chief Complaint  Patient presents with  . Dizziness    Jeanne [name] is a 54 y.o. female.  HPI Patient presents with ear pain difficulty hearing and vertigo.  Ear pain difficulty hearing that been there for couple days.  Woke up this morning with vertigo.  States it was bad enough that she vomited.  Feeling somewhat better now.  Have been worse moving her head.  No headache.  No confusion.  Did have stroke 2 months ago.  Does have history of vertigo years ago.  No numbness or weakness.    Past Medical History:  Diagnosis Date  . Allergy   . History of chicken pox   . Hypertension   . Hyperthyroidism   . LV dysfunction   . Morbid obesity (HCC)   . Systolic heart failure (HCC)   . Thyroid enlargement    on imaging    Patient Active Problem List   Diagnosis Date Noted  . Cardiomyopathy (HCC) 09/08/2019  . 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 Left-sided low back pain without sciatica 05/12/2014  . Viral URI with cough 05/12/2014  . HTN (hypertension) 07/09/2013  . Hyperthyroidism 07/09/2013    Past Surgical History:  Procedure Laterality Date  . ABDOMINAL HYSTERECTOMY  1994   [place]  . BUBBLE STUDY  09/08/2019   Procedure: BUBBLE STUDY;  Surgeon: Sande Rives, MD;  Location: [facility] ENDOSCOPY;  Service: Cardiovascular;;  . IR ANGIO INTRA EXTRACRAN SEL COM CAROTID INNOMINATE BILAT MOD SED  09/03/2019  . IR ANGIO VERTEBRAL SEL SUBCLAVIAN INNOMINATE UNI R MOD SED  09/03/2019  . RADIOLOGY WITH ANESTHESIA N/A 09/03/2019   Procedure: IR WITH ANESTHESIA;  Surgeon: Radiologist, Medication, MD;  Location: [facility] OR;  Service: Radiology;  Laterality: N/A;  . TEE WITHOUT CARDIOVERSION N/A 09/08/2019   Procedure: TRANSESOPHAGEAL ECHOCARDIOGRAM (TEE);  Surgeon: Sande Rives, MD;  Location: [facility] ENDOSCOPY;  Service: Cardiovascular;  Laterality: N/A;     OB History   No obstetric history on file.     Family History  Problem Relation Age of Onset  . Hypertension Maternal Grandmother   . Diabetes Maternal Grandmother   . Dementia Mother     Social History   Tobacco Use  . Smoking status: Never Smoker  . Smokeless tobacco: Never Used  Vaping Use  . Vaping Use: Never used  Substance Use Topics  . Alcohol use: Yes    Comment: socially  . Drug use: No    Home Medications Prior to Admission medications   Medication Sig Start Date End Date Taking? Authorizing Provider  aspirin EC 81 MG EC tablet Take 1 tablet (81 mg total) by mouth daily. Swallow whole. 09/09/19   Layne Benton, NP  losartan (COZAAR) 25 MG tablet Take 1 tablet (25 mg total) by mouth daily. 10/11/19 01/09/20  Dunn, Tacey Ruiz, PA-C  Magnesium Oxide 400 MG CAPS Take 1 capsule (400 mg total) by mouth daily. 10/11/19   Dunn, Tacey Ruiz, PA-C  meclizine (ANTIVERT) 25 MG tablet Take 1 tablet (25 mg total) by mouth 3 (three) times daily as needed for dizziness. 10/30/19   Benjiman Core, MD  methimazole (TAPAZOLE) 10 MG tablet Take 1 tablet (10 mg total) by mouth daily. Patient not taking: Reported on 10/18/2019 09/30/19   Shea Evans,  Dayna N, PA-C  potassium chloride SA (KLOR-CON) 20 MEQ tablet Take 1 tablet (20 mEq total) by mouth daily. 10/18/19   Laurann Montana, PA-C    Allergies    Patient has no known allergies.  Review of Systems   Review of Systems  Constitutional: Negative for appetite change.  HENT: Positive for ear pain.        Difficulty hearing.  Vertigo.  Cardiovascular: Negative for chest pain.  Gastrointestinal: Negative for abdominal pain.  Genitourinary: Negative for flank pain.  Musculoskeletal: Negative for back pain.  Skin: Negative for rash.  Neurological: Negative for weakness.  Psychiatric/Behavioral: Negative for confusion.    Physical Exam Updated Vital Signs BP  134/73 (BP Location: Left Arm)   Pulse 64   Temp 97.9 F (36.6 C) (Oral)   Resp 15   Ht 5\' 11"  (1.803 m)   Wt 117 kg   SpO2 100%   BMI 35.98 kg/m   Physical Exam Vitals and nursing note reviewed.  Constitutional:      Appearance: Normal appearance.  HENT:     Head: Normocephalic.     Ears:     Comments: Left TM obscured by cerumen.  Right TM with some injection and mild bulging. Eyes:     Extraocular Movements: Extraocular movements intact.  Cardiovascular:     Rate and Rhythm: Normal rate and regular rhythm.  Pulmonary:     Breath sounds: No rhonchi.  Chest:     Chest wall: No tenderness.  Abdominal:     Tenderness: There is no abdominal tenderness.  Skin:    General: Skin is warm.     Capillary Refill: Capillary refill takes less than 2 seconds.  Neurological:     Mental Status: She is oriented to person, place, and time.     Comments: Finger-nose intact bilaterally.  Face symmetric.  Eye movements intact.  Awake and appropriate.      ED Results / Procedures / Treatments   Labs (all labs ordered are listed, but only abnormal results are displayed) Labs Reviewed - No data to display  EKG None  Radiology No results found.  Procedures Procedures (including critical care time)  Medications Ordered in ED Medications - No data to display  ED Course  I have reviewed the triage vital signs and the nursing notes.  Pertinent labs & imaging results that were available during my care of the patient were reviewed by me and considered in my medical decision making (see chart for details).    MDM Rules/Calculators/A&P                          Patient with vertigo.  Began this morning but has had difficulty hearing for the last couple days.  I think this is most likely peripheral vertigo.  Patient improved significantly after removal of the cerumen obstructing the left TM.  Also has clouding of the right TM.  Stroke considered especially with recent stroke but felt  less likely.  Discussed with the patient about other warning signs to watch for.  At this point do not think that she needs an MRI to evaluate for a central cause of the stroke.  Will give symptomatic treatment and outpatient follow-up with ENT. Final Clinical Impression(s) / ED Diagnoses Final diagnoses:  Peripheral vertigo, unspecified laterality    Rx / DC Orders ED Discharge Orders         Ordered    meclizine (ANTIVERT) 25 MG  tablet  3 times daily PRN        10/30/19 1329           Benjiman Core, MD 10/30/19 1336

## 2019-11-01 ENCOUNTER — Ambulatory Visit: Payer: Self-pay | Admitting: *Deleted

## 2019-11-01 ENCOUNTER — Ambulatory Visit (HOSPITAL_COMMUNITY)
Admission: RE | Admit: 2019-11-01 | Discharge: 2019-11-01 | Disposition: A | Discharge status: Home / Self Care | Payer: Self-pay | Source: Ambulatory Visit | Attending: "Endocrinology | Admitting: "Endocrinology

## 2019-11-01 ENCOUNTER — Other Ambulatory Visit: Payer: Self-pay

## 2019-11-01 DIAGNOSIS — E0520 Thyrotoxicosis with toxic multinodular goiter without thyrotoxic crisis or storm: Secondary | ICD-10-CM | POA: Insufficient documentation

## 2019-11-02 ENCOUNTER — Telehealth: Payer: Self-pay | Admitting: Student

## 2019-11-02 ENCOUNTER — Ambulatory Visit (INDEPENDENT_AMBULATORY_CARE_PROVIDER_SITE_OTHER): Payer: Self-pay | Admitting: "Endocrinology

## 2019-11-02 ENCOUNTER — Encounter: Payer: Self-pay | Admitting: "Endocrinology

## 2019-11-02 VITALS — BP 136/80 | HR 84 | Ht 71.0 in | Wt 256.6 lb

## 2019-11-02 DIAGNOSIS — E0520 Thyrotoxicosis with toxic multinodular goiter without thyrotoxic crisis or storm: Secondary | ICD-10-CM | POA: Insufficient documentation

## 2019-11-02 NOTE — Telephone Encounter (Signed)
Thanks. I will route to Dr. Wyline Mood for input -  this is a 54y/o female with HTN and longstanding untreated hyperthyroidism who was in the hospital recently for stroke, found to have LVEF 25-30%. Medications initiated - could not afford Entresto so she is on BB/ARB. I have since gotten her into see endocrine as well and she is undergoing treatment for her hyperthyroidism. At the time we last messaged we discussed whether or not to pursue ischemic testing at this juncture. You had recommended treatment of the thyroid disease, medical therapy for CHF and ordering repeat echo in 3 months to reassess LVEF. 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 Also sees [place] in follow-up 11/25/19 to see how she is doing. Thoughts on returning to work? Appreciate your input. Jeanne [name]

## 2019-11-02 NOTE — Telephone Encounter (Signed)
New message    Patient is requesting a note that is extending her to be out of work longer.

## 2019-11-02 NOTE — Telephone Encounter (Signed)
Agree. See below. Make sure patient knows to listen to her body when easing back into work, and remind her of her upcoming appt in September.

## 2019-11-02 NOTE — Telephone Encounter (Signed)
Please find out the nature of her work and the level of exertion as well as how she is feeling. Thanks. Terica Yogi PA-C

## 2019-11-02 NOTE — Telephone Encounter (Signed)
Spoke with pt who states that she needs a note stating if she needs to stay out of work or if she may return to work. Pt reports being out of work since the start of July. Please advise.

## 2019-11-02 NOTE — Telephone Encounter (Signed)
Ok to return to work from my standpoint if she feels up to it,  no reason to limit activities from medical standpoint.    Dominga Ferry MD

## 2019-11-02 NOTE — Progress Notes (Signed)
11/02/2019      Endocrinology follow-up note    Subjective:    Patient ID: Jeanne [name], female    DOB: Dec 14, 1965, PCP Patient, No Pcp Per.   Past Medical History:  Diagnosis Date  . Allergy   . History of chicken pox   . Hypertension   . Hyperthyroidism   . LV dysfunction   . Morbid obesity (HCC)   . Systolic heart failure (HCC)   . Thyroid enlargement    on imaging    Past Surgical History:  Procedure Laterality Date  . ABDOMINAL HYSTERECTOMY  1994   [place]  . BUBBLE STUDY  09/08/2019   Procedure: BUBBLE STUDY;  Surgeon: Sande Rives, MD;  Location: [facility] ENDOSCOPY;  Service: Cardiovascular;;  . IR ANGIO INTRA EXTRACRAN SEL COM CAROTID INNOMINATE BILAT MOD SED  09/03/2019  . IR ANGIO VERTEBRAL SEL SUBCLAVIAN INNOMINATE UNI R MOD SED  09/03/2019  . RADIOLOGY WITH ANESTHESIA N/A 09/03/2019   Procedure: IR WITH ANESTHESIA;  Surgeon: Radiologist, Medication, MD;  Location: [facility] OR;  Service: Radiology;  Laterality: N/A;  . TEE WITHOUT CARDIOVERSION N/A 09/08/2019   Procedure: TRANSESOPHAGEAL ECHOCARDIOGRAM (TEE);  Surgeon: Sande Rives, MD;  Location: [facility] ENDOSCOPY;  Service: Cardiovascular;  Laterality: N/A;    Social History   Socioeconomic History  . Marital status: Single    Spouse name: Not on file  . Number of children: Not on file  . Years of education: 58  . Highest education level: 12th grade  Occupational History  . Occupation: Unable to work due to recent stroke.  Tobacco Use  . Smoking status: Never Smoker  . Smokeless tobacco: Never Used  Vaping Use  . Vaping Use: Never used  Substance and Sexual Activity  . Alcohol use: Yes    Comment: socially  . Drug use: No  . Sexual activity: Yes  Other Topics Concern  . Not on file  Social History Narrative  . Not on file   Social Determinants of Health   Financial Resource Strain: High Risk  . Difficulty of Paying Living Expenses: Very hard  Food Insecurity: No Food Insecurity  .  Worried About Programme researcher, broadcasting/film/video in the Last Year: Never true  . Ran Out of Food in the Last Year: Never true  Transportation Needs: No Transportation Needs  . Lack of Transportation (Medical): No  . Lack of Transportation (Non-Medical): No  Physical Activity: Inactive  . Days of Exercise per Week: 0 days  . Minutes of Exercise per Session: 0 min  Stress: Stress Concern Present  . Feeling of Stress : Very much  Social Connections: Moderately Isolated  . Frequency of Communication with Friends and Family: More than three times a week  . Frequency of Social Gatherings with Friends and Family: More than three times a week  . Attends Religious Services: More than 4 times per year  . Active Member of Clubs or Organizations: No  . Attends Banker Meetings: Never  . Marital Status: Separated    Family History  Problem Relation Age of Onset  . Hypertension Maternal Grandmother   . Diabetes Maternal Grandmother   . Dementia Mother     Outpatient Encounter Medications as of 11/02/2019  Medication Sig  . aspirin EC 81 MG EC tablet Take 1 tablet (81 mg total) by mouth daily. Swallow whole.  . losartan (COZAAR) 25 MG tablet Take 1 tablet (25 mg total) by mouth daily.  . Magnesium Oxide 400 MG  CAPS Take 1 capsule (400 mg total) by mouth daily.  . meclizine (ANTIVERT) 25 MG tablet Take 1 tablet (25 mg total) by mouth 3 (three) times daily as needed for dizziness.  . methimazole (TAPAZOLE) 10 MG tablet Take 1 tablet (10 mg total) by mouth daily. (Patient not taking: Reported on 10/18/2019)  . potassium chloride SA (KLOR-CON) 20 MEQ tablet Take 1 tablet (20 mEq total) by mouth daily.   No facility-administered encounter medications on file as of 11/02/2019.    ALLERGIES: No Known Allergies  VACCINATION STATUS:  There is no immunization history on file for this patient.   HPI  Jeanne [name] is 54 y.o. female who presents today with a medical history as above. she is being  seen in follow-up with thyroid ultrasound after her thyroid uptake showed possible nodular area in her thyroid.     she was seen in consultation for hyperthyroidism requested by Ronie Spies, PA-C. -Patient is a poor historian.  History is obtained by combination of patient interview and chart review. -Based on reviews, she was diagnosed with hyperthyroidism in 2015 with uptake and scan showing 51% uptake in 24 hours.  She did not stay with that provider for various reasons.  She apparently various symptoms over the years including weight loss, palpitations, tremors, heat intolerance.  She also developed several complications including cardiomyopathy, heart failure.  More recently, she was found to have high levels of thyroid hormone lab work.  She was restarted on methimazole 10 mg p.o. daily, took it for only few days.   During her last visit, methemoglobin was held for 5 days and repeat uptake and scan was performed which was significant for 25% uptake in 4 hours, and 20% in 24 hours.  Uptake also showed large asymmetric multinodular goiter right greater than left.  Patient denies dysphagia, shortness of breath, no voice change. Subsequent thyroid ultrasound is significant for 1.7 cm nodule, no suspicious features.  Admittedly, she has been hesitant to do definitive treatment with radioactive iodine treatment which was mentioned to her at one point.  her most recent thyroid labs revealed total T3 of 311 and free T4 of 2.92 on September 27, 2019.  She did not have recent thyroid uptake and scan. -She reports goiter.   she denies family history of thyroid dysfunction, or thyroid malignancy.  - she  is hesitantly accepting to proceed with appropriate work up and therapy for thyrotoxicosis.                           Review of systems  Constitutional: + Fluctuating body weight,  + fatigue, + subjective hyperthermia Eyes: no blurry vision, + xerophthalmia ENT: no sore throat, no nodules palpated in  throat, no dysphagia/odynophagia, nor hoarseness Cardiovascular: no Chest Pain, no Shortness of Breath, -  palpitations, no leg swelling Respiratory: no cough, no SOB Gastrointestinal: no Nausea, no Vomiting, no Diarhhea Musculoskeletal: no muscle/joint aches Skin: no rashes Neurological: -  tremors, no numbness, no tingling, no dizziness Psychiatric: no depression, +  anxiety   Objective:    BP 136/80   Pulse 84   Ht 5\' 11"  (1.803 m)   Wt 256 lb 9.6 oz (116.4 kg)   BMI 35.79 kg/m   Wt Readings from Last 3 Encounters:  11/02/19 256 lb 9.6 oz (116.4 kg)  10/30/19 258 lb (117 kg)  10/25/19 256 lb 8 oz (116.3 kg)  Physical exam  Constitutional: Body mass index is 35.79 kg/m., not in acute distress, +stable state of mind Eyes: PERRLA, EOMI, + exophthalmos ENT: moist mucous membranes, +  thyromegaly, no cervical lymphadenopathy Cardiovascular: + normal precordial activity, -tachycardic,  no Murmur/Rubs/Gallops Respiratory:  adequate breathing efforts, no gross chest deformity, Clear to auscultation bilaterally Gastrointestinal: abdomen soft, Non -tender, No distension, Bowel Sounds present Musculoskeletal: no gross deformities, strength intact in all four extremities Skin: moist, warm, no rashes Neurological: +  tremor with outstretched hands,  + Deep Tendon Reflexes  on both lower extremities.   CMP     Component Value Date/Time   NA 138 10/15/2019 1018   K 3.9 10/27/2019 0932   CL 104 10/15/2019 1018   CO2 25 10/15/2019 1018   GLUCOSE 101 (H) 10/15/2019 1018   BUN 11 10/15/2019 1018   CREATININE 0.48 10/15/2019 1018   CALCIUM 9.4 10/15/2019 1018   PROT 6.9 09/03/2019 1340   ALBUMIN 3.5 09/03/2019 1340   AST 17 09/03/2019 1340   ALT 15 09/03/2019 1340   ALKPHOS 106 09/03/2019 1340   BILITOT 0.6 09/03/2019 1340   GFRNONAA >60 10/15/2019 1018   GFRAA >60 10/15/2019 1018     CBC    Component Value Date/Time   WBC  6.9 09/08/2019 0435   RBC 5.98 (H) 09/08/2019 0435   HGB 12.2 09/08/2019 0435   HCT 38.9 09/08/2019 0435   PLT 178 09/08/2019 0435   MCV 65.1 (L) 09/08/2019 0435   MCH 20.4 (L) 09/08/2019 0435   MCHC 31.4 09/08/2019 0435   RDW 21.7 (H) 09/08/2019 0435   LYMPHSABS 1.6 09/03/2019 1340   MONOABS 0.9 09/03/2019 1340   EOSABS 0.1 09/03/2019 1340   BASOSABS 0.0 09/03/2019 1340     Diabetic Labs (most recent): Lab Results  Component Value Date   HGBA1C 5.6 09/04/2019    Lipid Panel     Component Value Date/Time   CHOL 100 09/04/2019 1003   TRIG 31 09/04/2019 1003   HDL 43 09/04/2019 1003   CHOLHDL 2.3 09/04/2019 1003   VLDL 6 09/04/2019 1003   LDLCALC 51 09/04/2019 1003     Lab Results  Component Value Date   TSH <0.010 (L) 09/07/2019   TSH 0.12 (L) 05/11/2014   FREET4 2.92 (H) 09/27/2019   FREET4 2.01 (H) 05/11/2014    Review of her uptake and scan from May 20, 2013 showed 51%  Diffuse activity on the right lobe of the thyroid, no activity on the left lobe.  Repeat thyroid uptake and scan on October 22, 2019 FINDINGS: Inhomogeneous increased tracer uptake throughout enlarged RIGHT thyroid lobe.  Slightly enlarged area of decreased uptake in the midportion of the RIGHT lobe versus prior.  Suppression of uptake within LEFT lobe, only faintly visible.  4 hour I-123 uptake = 25% (normal 5-20%),   24 hour I-123 uptake = 20% (normal 10-30%)  IMPRESSION: Elevated 4 hour and normal 24 hour radio iodine uptakes.  Diffusely increased tracer uptake throughout an enlarged heterogeneous RIGHT thyroid lobe.  Patient has a markedly enlarged multinodular appearing RIGHT thyroid lobe on recent CTA neck with normal size and appearance of the LEFT lobe.  Please note that the patient received iodinated contrast material on 09/03/2019; the iodine load from contrast has likely cleared in the nearly 7 weeks since administration and is probably not significantly  affecting radio iodine uptake at present.   November 01, 2019 thyroid ultrasound: Right lobe 1.2 cm, left lobe 4.9 cm. The entire right  thyroid lobe is replaced by Lovenox heterogeneous thyroid tissue consistent with diffuse goitrous replacement.  1.7 cm nodule on the mid left lobe.  Described as solid/almost completely solid, isoechoic ACR TI-RADS 3 nodule which did not meet criteria for biopsy, however will need follow-up ultrasound.   Assessment & Plan:   1. Hyperthyroidism-toxic multinodular goiter  her history and most recent labs are reviewed, and she was examined clinically. Subjective and objective findings are consistent with thyrotoxicosis likely from primary hyperthyroidism. The potential risks of untreated thyrotoxicosis including worsening cardiomyopathy, thyroid storm were discussed in detail with her and her partner, and the need for definitive therapy have been discussed in detail with her, and she agrees to proceed with diagnostic workup and treatment plan.  -Based on the findings of her most recent thyroid uptake and scan, and her subsequent baseline thyroid ultrasound , she is cleared for ablative treatment with I-131.  This procedure will be scheduled to be administered in the next several days at [facility] nuclear medicine.   -  She will stay off of methimazole and return in 1 week for reevaluation.  -Patient is made aware of the high likelihood of post ablative hypothyroidism with subsequent need for lifelong thyroid hormone replacement. sheunderstands this outcome  and she is  willing to proceed.    She has a large goiter with 1.7 cm nodule in the left lobe, will need repeat thyroid ultrasound in 1 year.   -Her pulse rate is controlled at 68-84, did not initiate any new prescriptions for her today.  She is currently on losartan 25 mg p.o. daily.  -Patient is advised to maintain close follow up with her cardiology care, and identify primary care provider.       - Time spent on this patient care encounter:  20 minutes of which 50% was spent in  counseling and the rest reviewing  her current and  previous labs / studies and medications  doses and developing a plan for long term care. [facility]  participated in the discussions, expressed understanding, and voiced agreement with the above plans.  All questions were answered to her satisfaction. she is encouraged to contact clinic should she have any questions or concerns prior to her return visit.    Follow up plan: Return in about 9 weeks (around 01/04/2020) for F/U with Labs after I131 Therapy.   Thank you for involving me in the care of this pleasant patient, and I will continue to update you with her progress.  Marquis Lunch, MD [facility] Phone: [phone]  Fax: [phone]   11/02/2019, 1:11 PM  This note was partially dictated with voice recognition software. Similar sounding words can be transcribed inadequately or may not  be corrected upon review.

## 2019-11-02 NOTE — Telephone Encounter (Signed)
Pt reports that she is feeling good. She states that she sits down when she feels tired. States that she does not have any chest pain. 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 Pt reports at she works only part time, 5 hour days. Most of the day is spent outside. Please advise.

## 2019-11-03 ENCOUNTER — Encounter: Payer: Self-pay | Admitting: *Deleted

## 2019-11-03 NOTE — Telephone Encounter (Signed)
Patient informed and verbalized understanding of plan. Plan to pick up copy of letter at front desk [place] office

## 2019-11-09 ENCOUNTER — Telehealth: Payer: Self-pay | Admitting: "Endocrinology

## 2019-11-09 NOTE — Telephone Encounter (Signed)
Returned call to patient and advised. Had to leave a voicemail.

## 2019-11-09 NOTE — Telephone Encounter (Signed)
Patient left a VM asking how long she needs to stay off of her Methimzaole. She is going Friday for her nuc med scan.

## 2019-11-11 ENCOUNTER — Other Ambulatory Visit: Payer: Self-pay | Admitting: *Deleted

## 2019-11-11 NOTE — Patient Outreach (Signed)
[facility]) Care Management  11/11/2019  Khushi Zupko 1965-12-24 [id number]   Subjective: Telephone call to patient's home / mobile number, spoke with patient, and HIPAA verified.  Patient states she is doing good, had a recent ED visit for vertigo, and is feeling much better.  States thyroid ultrasound was completed, no cancer, has enlarged thyroid, and the thyroid is very active.   State she will have a nuclear medicine treatment in radiology on 11/12/2019 to shrink the thyroid.   States she has not had a chance to follow up on obtaining a primary MD, has the information, and planning to follow up after things calm down with her thyroid issues.      Patient states she receive a letter from [facility] stating that she was not eligible for Medicaid and is in the process of follow up on community resources for financial assistance.  Patient states she does not have any education material, EMMI follow up, care coordination, care management, disease monitoring, transportation, community resource, or pharmacy needs at this time.  She is aware that the 30 day EMMI automated call follow up is completed.  States she is very appreciative of the follow up and all this RNCM's assistance.    Objective:Per KPN (Knowledge Performance Now, point of care tool) and chart review,patient hospitalized 09/03/2019 - 09/08/2019 for Stroke (cerebrum),Middle cerebral artery embolism, right. Patient also has a history of hypertension, hyperthyroidism, and Cardiomyopathy.     Assessment:Received Self Pay EMMI Stroke Red Flag follow up referral on 09/28/2019. Red Flag Alert Trigger, Day # 13, patient answered no to the following question: Went to follow-up appointment? EMMI follow up completed and no further care management needs.       Plan:RNCM will complete case closure due to follow up completed / no care management needs.        Jelissa Espiritu H. Gardiner Barefoot, BSN, CCM [facility]  Care Management [facility] Telephonic CM Phone: [phone] Fax: [phone]

## 2019-11-12 ENCOUNTER — Other Ambulatory Visit: Payer: Self-pay

## 2019-11-12 ENCOUNTER — Encounter (HOSPITAL_COMMUNITY)
Admission: RE | Admit: 2019-11-12 | Discharge: 2019-11-12 | Disposition: A | Discharge status: Home / Self Care | Payer: Self-pay | Source: Ambulatory Visit | Attending: "Endocrinology | Admitting: "Endocrinology

## 2019-11-12 ENCOUNTER — Encounter (HOSPITAL_COMMUNITY): Payer: Self-pay

## 2019-11-12 DIAGNOSIS — E0520 Thyrotoxicosis with toxic multinodular goiter without thyrotoxic crisis or storm: Secondary | ICD-10-CM | POA: Insufficient documentation

## 2019-11-12 MED ORDER — SODIUM IODIDE I 131 CAPSULE
25.0000 | Freq: Once | INTRAVENOUS | Status: AC | PRN
  Administered 2019-11-12: 24.2 via ORAL

## 2019-11-19 ENCOUNTER — Telehealth: Payer: Self-pay | Admitting: General Practice

## 2019-11-19 NOTE — Telephone Encounter (Signed)
°   Jeanne [name] DOB: 07-21-1965 MRN: [id number]   RIDER WAIVER AND RELEASE OF LIABILITY  For purposes of improving physical access to our facilities, [facility] is pleased to partner with third parties to provide [facility] patients or other authorized individuals the option of convenient, on-demand ground transportation services (the [employer]) through use of the technology service that enables users to request on-demand ground transportation from independent third-party providers.  By opting to use and accept these [employer], I, the undersigned, hereby agree on behalf of myself, and on behalf of any minor child using the [employer] for whom I am the parent or legal guardian, as follows:  1. Science writer provided to me are provided by independent third-party transportation providers who are not [employer] or employees and who are unaffiliated with [employer]. 2. [place] is neither a transportation carrier nor a common or public carrier. 3. [facility] has no control over the quality or safety of the transportation that occurs as a result of the [employer]. 4. [facility] cannot guarantee that any third-party transportation provider will complete any arranged transportation service. 5. [facility] makes no representation, warranty, or guarantee regarding the reliability, timeliness, quality, safety, suitability, or availability of any of the Transport Services or that they will be error free. 6. I fully understand that traveling by vehicle involves risks and dangers of serious bodily injury, including permanent disability, paralysis, and death. I agree, on behalf of myself and on behalf of any minor child using the Transport Services for whom I am the parent or legal guardian, that the entire risk arising out of my use of the [employer] remains solely with me, to the maximum extent permitted under applicable law. 7. The [employer] are provided as is and as available. [place] [facility] disclaims all representations and warranties, express, implied or statutory, not expressly set out in these terms, including the implied warranties of merchantability and fitness for a particular purpose. 8. I hereby waive and release [name], its agents, employees, officers, directors, representatives, insurers, attorneys, assigns, successors, subsidiaries, and affiliates from any and all past, present, or future claims, demands, liabilities, actions, causes of action, or suits of any kind directly or indirectly arising from acceptance and use of the [employer]. 9. I further waive and release [facility] and its affiliates from all present and future liability and responsibility for any injury or death to persons or damages to property caused by or related to the use of the [employer]. 10. I have read this Waiver and Release of Liability, and I understand the terms used in it and their legal significance. This Waiver is freely and voluntarily given with the understanding that my right (as well as the right of any minor child for whom I am the parent or legal guardian using the [employer]) to legal recourse against [facility] in connection with the [employer] is knowingly surrendered in return for use of these services.   I attest that I read the consent document to [facility], gave Ms. Dearden the opportunity to ask questions and answered the questions asked (if any). I affirm that [facility] then provided consent for she's participation in this program.     Launa Grill

## 2019-11-25 ENCOUNTER — Ambulatory Visit (INDEPENDENT_AMBULATORY_CARE_PROVIDER_SITE_OTHER): Payer: Self-pay | Admitting: Student

## 2019-11-25 ENCOUNTER — Encounter: Payer: Self-pay | Admitting: Student

## 2019-11-25 ENCOUNTER — Other Ambulatory Visit: Payer: Self-pay

## 2019-11-25 VITALS — BP 142/86 | HR 104 | Ht 71.0 in | Wt 259.4 lb

## 2019-11-25 DIAGNOSIS — I10 Essential (primary) hypertension: Secondary | ICD-10-CM

## 2019-11-25 DIAGNOSIS — E0590 Thyrotoxicosis, unspecified without thyrotoxic crisis or storm: Secondary | ICD-10-CM

## 2019-11-25 DIAGNOSIS — I429 Cardiomyopathy, unspecified: Secondary | ICD-10-CM

## 2019-11-25 DIAGNOSIS — I5022 Chronic systolic (congestive) heart failure: Secondary | ICD-10-CM

## 2019-11-25 MED ORDER — CARVEDILOL 3.125 MG PO TABS: 3.1250 mg | ORAL_TABLET | Freq: Two times a day (BID) | ORAL | 3 refills | Status: AC

## 2019-11-25 NOTE — Patient Instructions (Signed)
Medication Instructions:  Re-start Coreg 3.125 mg twice a day   *If you need a refill on your cardiac medications before your next appointment, please call your pharmacy*   Lab Work: None today If you have labs (blood work) drawn today and your tests are completely normal, you will receive your results only by: Marland Kitchen MyChart Message (if you have MyChart) OR . A paper copy in the mail If you have any lab test that is abnormal or we need to change your treatment, we will call you to review the results.   Testing/Procedures: Your physician has requested that you have an echocardiogram in 2 months . Echocardiography is a painless test that uses sound waves to create images of your heart. It provides your doctor with information about the size and shape of your heart and how well your heart's chambers and valves are working. This procedure takes approximately one hour. There are no restrictions for this procedure.     Follow-Up: At [facility], you and your health needs are our priority.  As part of our continuing mission to provide you with exceptional heart care, we have created designated Provider Care Teams.  These Care Teams include your primary Cardiologist (physician) and Advanced Practice Providers (APPs -  Physician Assistants and Nurse Practitioners) who all work together to provide you with the care you need, when you need it.  We recommend signing up for the patient portal called "MyChart".  Sign up information is provided on this After Visit Summary.  MyChart is used to connect with patients for Virtual Visits (Telemedicine).  Patients are able to view lab/test results, encounter notes, upcoming appointments, etc.  Non-urgent messages can be sent to your provider as well.   To learn more about what you can do with MyChart, go to [email or web address].    Your next appointment:   3 month(s)  The format for your next appointment:   In Person  Provider:  Dr.Branch or Eloise Harman  Other Instructions None      Thank you for choosing [facility] !

## 2019-11-25 NOTE — Progress Notes (Signed)
Cardiology Office Note    Date:  11/25/2019   ID:  Jeanne [name], DOB 1966-02-17, MRN [id number]  PCP:  Patient, No Pcp Per  Cardiologist: Dina Rich, MD    Chief Complaint  Patient presents with  . Follow-up    2 month visit    History of Present Illness:    Jeanne [name] is a 54 y.o. female with past medical history of chronic systolic CHF (EF 93-81% by echo in 09/2019), CVA (occurring in 09/2019), HTN, HLD and Hyperthyroidism who presents to the office today for 70-month follow-up.   She was last examined by Ronie Spies, PA-C in 09/2019 following her recent hospitalization for a CVA. Cardiology was consulted during admission as she was found to have a reduced EF of 25 to 30%. She was started on ASA and Plavix per Neurology along with Carvedilol and Entresto from a cardiac perspective. At the time of her follow-up visit she reported having baseline dyspnea on exertion but denied any recent orthopnea or lower extremity edema. BP was soft at 102/58 during her visit, therefore her medications were not further titrated. Her case was reviewed with Dr. Wyline Mood who recommended a repeat echocardiogram in 3 months and if EF still reduced, then consider a cardiac catheterization once further out from her CVA. Was also recommended she be referred to Endocrinology for hyperthyroidism given no PCP.  It appears that Entresto was discontinued and switched to Losartan in the interim due to no insurance coverage. She did follow-up with Endocrinology in 10/2019 and a thyroid ultrasound was recommended with consideration of I-131 thyroid ablation pending results. This was eventually performed on 11/12/2019.  In talking with the patient today, she reports overall doing well since her last visit. She has felt fatigued since her recent thyroid ablation and has upcoming follow-up with Endocrinology. She is planning to return to work next week. She denies any recent chest pain or dyspnea on exertion. No  recent orthopnea, PND or lower extremity edema.   She was confused about which medications to stop following her thyroid procedure and believes she accidentally self-discontinued Coreg. Has remained on ASA, Losartan, Magnesium and K-dur.    Past Medical History:  Diagnosis Date  . Allergy   . History of chicken pox   . Hypertension   . Hyperthyroidism   . LV dysfunction    a. EF 25-30% by echo in 09/2019  . Morbid obesity (HCC)   . Systolic heart failure (HCC)   . Thyroid enlargement    on imaging    Past Surgical History:  Procedure Laterality Date  . ABDOMINAL HYSTERECTOMY  1994   [facility]  . BUBBLE STUDY  09/08/2019   Procedure: BUBBLE STUDY;  Surgeon: Sande Rives, MD;  Location: [facility] ENDOSCOPY;  Service: Cardiovascular;;  . IR ANGIO INTRA EXTRACRAN SEL COM CAROTID INNOMINATE BILAT MOD SED  09/03/2019  . IR ANGIO VERTEBRAL SEL SUBCLAVIAN INNOMINATE UNI R MOD SED  09/03/2019  . RADIOLOGY WITH ANESTHESIA N/A 09/03/2019   Procedure: IR WITH ANESTHESIA;  Surgeon: Radiologist, Medication, MD;  Location: [facility] OR;  Service: Radiology;  Laterality: N/A;  . TEE WITHOUT CARDIOVERSION N/A 09/08/2019   Procedure: TRANSESOPHAGEAL ECHOCARDIOGRAM (TEE);  Surgeon: Sande Rives, MD;  Location: [facility] ENDOSCOPY;  Service: Cardiovascular;  Laterality: N/A;    Current Medications: Outpatient Medications Prior to Visit  Medication Sig Dispense Refill  . aspirin EC 81 MG EC tablet Take 1 tablet (81 mg total) by mouth daily. Swallow whole. 30 tablet 11  .  losartan (COZAAR) 25 MG tablet Take 1 tablet (25 mg total) by mouth daily. 90 tablet 3  . Magnesium Oxide 400 MG CAPS Take 1 capsule (400 mg total) by mouth daily. 90 capsule 3  . potassium chloride SA (KLOR-CON) 20 MEQ tablet Take 1 tablet (20 mEq total) by mouth daily. 90 tablet 3  . meclizine (ANTIVERT) 25 MG tablet Take 1 tablet (25 mg total) by mouth 3 (three) times daily as needed for dizziness. (Patient not taking: Reported on 11/25/2019)  10 tablet 0  . methimazole (TAPAZOLE) 10 MG tablet Take 1 tablet (10 mg total) by mouth daily. (Patient not taking: Reported on 10/18/2019) 30 tablet 1   No facility-administered medications prior to visit.     Allergies:   Patient has no known allergies.   Social History   Socioeconomic History  . Marital status: Single    Spouse name: Not on file  . Number of children: Not on file  . Years of education: 7  . Highest education level: 12th grade  Occupational History  . Occupation: Unable to work due to recent stroke.  Tobacco Use  . Smoking status: Never Smoker  . Smokeless tobacco: Never Used  Vaping Use  . Vaping Use: Never used  Substance and Sexual Activity  . Alcohol use: Yes    Comment: socially  . Drug use: No  . Sexual activity: Yes  Other Topics Concern  . Not on file  Social History Narrative  . Not on file   Social Determinants of Health   Financial Resource Strain: High Risk  . Difficulty of Paying Living Expenses: Very hard  Food Insecurity: No Food Insecurity  . Worried About Programme researcher, broadcasting/film/video in the Last Year: Never true  . Ran Out of Food in the Last Year: Never true  Transportation Needs: No Transportation Needs  . Lack of Transportation (Medical): No  . Lack of Transportation (Non-Medical): No  Physical Activity: Inactive  . Days of Exercise per Week: 0 days  . Minutes of Exercise per Session: 0 min  Stress: Stress Concern Present  . Feeling of Stress : Very much  Social Connections: Moderately Isolated  . Frequency of Communication with Friends and Family: More than three times a week  . Frequency of Social Gatherings with Friends and Family: More than three times a week  . Attends Religious Services: More than 4 times per year  . Active Member of Clubs or Organizations: No  . Attends Banker Meetings: Never  . Marital Status: Separated     Family History:  The patient's family history includes Dementia in her mother;  Diabetes in her maternal grandmother; Hypertension in her maternal grandmother.   Review of Systems:   Please see the history of present illness.     General:  No chills, fever, night sweats or weight changes. Positive for fatigue.  Cardiovascular:  No chest pain, dyspnea on exertion, edema, orthopnea, palpitations, paroxysmal nocturnal dyspnea.  Dermatological: No rash, lesions/masses Respiratory: No cough, dyspnea Urologic: No hematuria, dysuria Abdominal:   No nausea, vomiting, diarrhea, bright red blood per rectum, melena, or hematemesis Neurologic:  No visual changes, wkns, changes in mental status. All other systems reviewed and are otherwise negative except as noted above.   Physical Exam:    VS:  BP (!) 142/86   Pulse (!) 104   Ht 5\' 11"  (1.803 m)   Wt 259 lb 6.4 oz (117.7 kg)   SpO2 99%   BMI 36.18 kg/m  General: Well developed, well nourished,female appearing in no acute distress. Head: Normocephalic, atraumatic. Neck: No carotid bruits. JVD not elevated.  Lungs: Respirations regular and unlabored, without wheezes or rales.  Heart: Regular rate and rhythm. No S3 or S4.  No murmur, no rubs, or gallops appreciated. Abdomen: Appears non-distended. No obvious abdominal masses. Msk:  Strength and tone appear normal for age. No obvious joint deformities or effusions. Extremities: No clubbing or cyanosis. No lower extremity edema.  Distal pedal pulses are 2+ bilaterally. Neuro: Alert and oriented X 3. Moves all extremities spontaneously. No focal deficits noted. Psych:  Responds to questions appropriately with a normal affect. Skin: No rashes or lesions noted  Wt Readings from Last 3 Encounters:  11/25/19 259 lb 6.4 oz (117.7 kg)  11/02/19 256 lb 9.6 oz (116.4 kg)  10/30/19 258 lb (117 kg)     Studies/Labs Reviewed:   EKG:  EKG is not ordered today.    Recent Labs: 09/03/2019: ALT 15 09/07/2019: TSH <0.010 09/08/2019: Hemoglobin 12.2; Platelets 178 10/15/2019: BUN 11;  Creatinine, Ser 0.48; Magnesium 1.9; Sodium 138 10/27/2019: Potassium 3.9   Lipid Panel    Component Value Date/Time   CHOL 100 09/04/2019 1003   TRIG 31 09/04/2019 1003   HDL 43 09/04/2019 1003   CHOLHDL 2.3 09/04/2019 1003   VLDL 6 09/04/2019 1003   LDLCALC 51 09/04/2019 1003    Additional studies/ records that were reviewed today include:   Echocardiogram: 09/2019 IMPRESSIONS    1. Technically difficult; LV function difficult to quantitate.  2. Left ventricular ejection fraction, by estimation, is 25 to 30%. The  left ventricle has severely decreased function. The left ventricle  demonstrates global hypokinesis. The left ventricular internal cavity size  was severely dilated. There is mild  left ventricular hypertrophy. Left ventricular diastolic parameters are  consistent with Grade III diastolic dysfunction (restrictive). Elevated  left atrial pressure.  3. Right ventricular systolic function is normal. The right ventricular  size is normal. Tricuspid regurgitation signal is inadequate for assessing  PA pressure.  4. Left atrial size was mildly dilated.  5. The mitral valve is normal in structure. Trivial mitral valve  regurgitation. No evidence of mitral stenosis.  6. The aortic valve is tricuspid. Aortic valve regurgitation is trivial.  No aortic stenosis is present.  7. The inferior vena cava is dilated in size with >50% respiratory  variability, suggesting right atrial pressure of 8 mmHg.    Assessment:    1. Chronic systolic heart failure (HCC)   2. Cardiomyopathy, unspecified type (HCC)   3. Essential hypertension   4. Hyperthyroidism      Plan:   In order of problems listed above:  1. Chronic Systolic CHF/Cardiomyopathy - She has a known reduced EF of 25-30% by echo in 09/2019. She denies any recent dyspnea on exertion, orthopnea, PND or lower extremity edema. Appears euvolemic on examination.  - Will plan to restart Coreg 3.125mg  BID. Continue  Losartan 25mg  daily. Previously on Entresto but unable to afford given no insurance coverage.  - Will plan to obtain an updated echocardiogram in 2 months for reassessment of EF and wall motion. Will delay this for a bit given re-initiation of Coreg. If EF remains reduced, will need to further address ischemic evaluation at that time.   2. HTN - BP is elevated at 142/86 during today's visit and she has not been taking Coreg as outlined above. Will plan to restart Coreg 3.125mg  BID and continue Losartan 25mg  daily. I did  encourage her to keep a BP log at home and report back if BP remains elevated as Losartan or Coreg could be further titrated.   3. Hyperthyroidism - Being followed by Endocrinology and recently underwent I-131 thyroid ablation on 11/12/2019. Encouraged to keep scheduled follow-up with Endocrinology.    Medication Adjustments/Labs and Tests Ordered: Current medicines are reviewed at length with the patient today.  Concerns regarding medicines are outlined above.  Medication changes, Labs and Tests ordered today are listed in the Patient Instructions below. Patient Instructions  Medication Instructions:  Re-start Coreg 3.125 mg twice a day   *If you need a refill on your cardiac medications before your next appointment, please call your pharmacy*   Lab Work: None today If you have labs (blood work) drawn today and your tests are completely normal, you will receive your results only by: Marland Kitchen MyChart Message (if you have MyChart) OR . A paper copy in the mail If you have any lab test that is abnormal or we need to change your treatment, we will call you to review the results.   Testing/Procedures: Your physician has requested that you have an echocardiogram in 2 months . Echocardiography is a painless test that uses sound waves to create images of your heart. It provides your doctor with information about the size and shape of your heart and how well your heart's chambers and  valves are working. This procedure takes approximately one hour. There are no restrictions for this procedure.  Follow-Up: At [facility], you and your health needs are our priority.  As part of our continuing mission to provide you with exceptional heart care, we have created designated Provider Care Teams.  These Care Teams include your primary Cardiologist (physician) and Advanced Practice Providers (APPs -  Physician Assistants and Nurse Practitioners) who all work together to provide you with the care you need, when you need it.  We recommend signing up for the patient portal called "MyChart".  Sign up information is provided on this After Visit Summary.  MyChart is used to connect with patients for Virtual Visits (Telemedicine).  Patients are able to view lab/test results, encounter notes, upcoming appointments, etc.  Non-urgent messages can be sent to your provider as well.   To learn more about what you can do with MyChart, go to [email or web address].    Your next appointment:   3 month(s)  The format for your next appointment:   In Person  Provider:  Dr.Branch or Eloise Harman  Other Instructions None   Thank you for choosing [facility] !         Signed, Ellsworth Lennox, PA-C  11/25/2019 6:18 PM    [facility] [address] [place], [place] [address] Phone: [phone] Fax: [phone]

## 2019-12-01 ENCOUNTER — Telehealth: Payer: Self-pay | Admitting: Student

## 2019-12-01 NOTE — Telephone Encounter (Signed)
Pt notified and will continue to monitor chest pain and seek ED evaluation for exertional pain.

## 2019-12-01 NOTE — Telephone Encounter (Signed)
New message     Pt c/o of Chest Pain: STAT if CP now or developed within 24 hours  1. Are you having CP right now? Woke up this morning with it   2. Are you experiencing any other symptoms (ex. SOB, nausea, vomiting, sweating)? no  3. How long have you been experiencing CP? Couple hours comes and goes its in the center of chest right above  her breast   4. Is your CP continuous or coming and going? Comes and goes   5. Have you taken Nitroglycerin? no ?

## 2019-12-01 NOTE — Telephone Encounter (Signed)
Pt c/o chest pain that started last night. Its located above the left breast. The pain comes and goes and last around 6-7 sec at a time. Pt denies pain at this time. She also denies SOB, sweating, nausea, vomiting. Denies pain with movement. States that it comes while she is sitting or lying. Please advise.

## 2019-12-01 NOTE — Telephone Encounter (Signed)
     Her episodes of chest pain seem atypical for a blockage as they are lasting for seconds at a time and not worse with exertion. If it is worse when she is lying down, I would recommend we move up her echocardiogram instead of waiting until 01/2020 as discussed at the time of her recent visit.  If she develops worsening chest pain or exertional pain in the interim, she should go to the Emergency Department for evaluation.  Signed, Ellsworth Lennox, PA-C 12/01/2019, 3:27 PM Pager: [phone]

## 2019-12-08 ENCOUNTER — Ambulatory Visit (HOSPITAL_COMMUNITY)
Admission: RE | Admit: 2019-12-08 | Discharge: 2019-12-08 | Disposition: A | Discharge status: Home / Self Care | Payer: Self-pay | Source: Ambulatory Visit | Attending: Student | Admitting: Student

## 2019-12-08 ENCOUNTER — Other Ambulatory Visit: Payer: Self-pay

## 2019-12-08 DIAGNOSIS — I5022 Chronic systolic (congestive) heart failure: Secondary | ICD-10-CM | POA: Insufficient documentation

## 2019-12-08 LAB — ECHOCARDIOGRAM COMPLETE
AR max vel: 1.94 cm2
AV Area VTI: 2.21 cm2
AV Area mean vel: 1.81 cm2
AV Mean grad: 4.4 mmHg
AV Peak grad: 8.8 mmHg
Ao pk vel: 1.48 m/s
Area-P 1/2: 3.1 cm2
S' Lateral: 3.62 cm

## 2019-12-08 NOTE — Progress Notes (Signed)
*  PRELIMINARY RESULTS* Echocardiogram 2D Echocardiogram has been performed.  Jeanne [name] 12/08/2019, 12:28 PM

## 2019-12-09 ENCOUNTER — Other Ambulatory Visit: Payer: Self-pay

## 2019-12-09 NOTE — Patient Outreach (Signed)
[facility]) Care Management  12/09/2019  Sameerah Nachtigal [birth year] [id number]  First telephone outreach attempt to obtain mRS. No answer. Left message for returned call.  [facility] Management Assistant [phone]

## 2019-12-17 ENCOUNTER — Other Ambulatory Visit: Payer: Self-pay

## 2019-12-17 NOTE — Patient Outreach (Signed)
[facility]) Care Management  12/17/2019  Jeanne [name] 1965-12-19 [id number]   Telephone outreach to patient to obtain mRS was successfully completed. MRS=1  Domingo Cocking [facility] Care Management Assistant [phone]

## 2019-12-27 ENCOUNTER — Other Ambulatory Visit: Payer: Self-pay

## 2019-12-27 ENCOUNTER — Other Ambulatory Visit (HOSPITAL_COMMUNITY)
Admission: RE | Admit: 2019-12-27 | Discharge: 2019-12-27 | Disposition: A | Discharge status: Home / Self Care | Payer: Self-pay | Source: Ambulatory Visit | Attending: "Endocrinology | Admitting: "Endocrinology

## 2019-12-27 DIAGNOSIS — E0520 Thyrotoxicosis with toxic multinodular goiter without thyrotoxic crisis or storm: Secondary | ICD-10-CM | POA: Insufficient documentation

## 2019-12-27 LAB — TSH: TSH: <0.01 u[IU]/mL — ABNORMAL LOW (ref 0.350–4.500)

## 2019-12-27 LAB — T4, FREE: Free T4: 1.16 ng/dL — ABNORMAL HIGH (ref 0.61–1.12)

## 2019-12-29 ENCOUNTER — Telehealth: Payer: Self-pay | Admitting: "Endocrinology

## 2019-12-29 NOTE — Telephone Encounter (Signed)
Pt says she will be out of town for a while and would like to know if her appt on 11/2 can be virtual.

## 2019-12-29 NOTE — Telephone Encounter (Signed)
Yes, it can be virtual. 

## 2019-12-29 NOTE — Telephone Encounter (Signed)
lvm informing pt

## 2020-01-04 ENCOUNTER — Encounter: Payer: Self-pay | Admitting: "Endocrinology

## 2020-01-04 ENCOUNTER — Telehealth (INDEPENDENT_AMBULATORY_CARE_PROVIDER_SITE_OTHER): Payer: Self-pay | Admitting: "Endocrinology

## 2020-01-04 VITALS — BP 135/90 | Ht 71.0 in | Wt 263.0 lb

## 2020-01-04 DIAGNOSIS — E0520 Thyrotoxicosis with toxic multinodular goiter without thyrotoxic crisis or storm: Secondary | ICD-10-CM

## 2020-01-04 NOTE — Progress Notes (Signed)
01/04/2020                                    Endocrinology Telehealth Visit Follow up Note -During COVID -19 Pandemic  This visit type was conducted  via telephone due to national recommendations for restrictions regarding the COVID-19 Pandemic  in an effort to limit this patient's exposure and mitigate transmission of the corona virus.   I connected with [facility] on 01/04/2020   by telephone and verified that I am speaking with the correct person using two identifiers. [place], 08-23-1965. she has verbally consented to this visit.  I was in my office and patient was in her residence. No other persons were with me during the encounter. All issues noted in this document were discussed and addressed. The format was not optimal for physical exam. Patient is relocated to  [facility], [place].     Subjective:    Patient ID: Jeanne [name], female    DOB: [birth year], PCP Patient, No Pcp Per.   Past Medical History:  Diagnosis Date  . Allergy   . History of chicken pox   . Hypertension   . Hyperthyroidism   . LV dysfunction    a. EF 25-30% by echo in 09/2019  . Morbid obesity (HCC)   . Systolic heart failure (HCC)   . Thyroid enlargement    on imaging    Past Surgical History:  Procedure Laterality Date  . ABDOMINAL HYSTERECTOMY  1994   [place]  . BUBBLE STUDY  09/08/2019   Procedure: BUBBLE STUDY;  Surgeon: Sande Rives, MD;  Location: [facility] ENDOSCOPY;  Service: Cardiovascular;;  . IR ANGIO INTRA EXTRACRAN SEL COM CAROTID INNOMINATE BILAT MOD SED  09/03/2019  . IR ANGIO VERTEBRAL SEL SUBCLAVIAN INNOMINATE UNI R MOD SED  09/03/2019  . RADIOLOGY WITH ANESTHESIA N/A 09/03/2019   Procedure: IR WITH ANESTHESIA;  Surgeon: Radiologist, Medication, MD;  Location: [facility] OR;  Service: Radiology;  Laterality: N/A;  . TEE WITHOUT CARDIOVERSION N/A 09/08/2019   Procedure: TRANSESOPHAGEAL ECHOCARDIOGRAM (TEE);  Surgeon: Sande Rives, MD;  Location: [facility]  ENDOSCOPY;  Service: Cardiovascular;  Laterality: N/A;    Social History   Socioeconomic History  . Marital status: Married    Spouse name: Not on file  . Number of children: Not on file  . Years of education: 24  . Highest education level: 12th grade  Occupational History  . Occupation: Unable to work due to recent stroke.  Tobacco Use  . Smoking status: Never Smoker  . Smokeless tobacco: Never Used  Vaping Use  . Vaping Use: Never used  Substance and Sexual Activity  . Alcohol use: Yes    Comment: socially  . Drug use: No  . Sexual activity: Yes  Other Topics Concern  . Not on file  Social History Narrative  . Not on file   Social Determinants of Health   Financial Resource Strain: High Risk  . Difficulty of Paying Living Expenses: Very hard  Food Insecurity: No Food Insecurity  . Worried About Programme researcher, broadcasting/film/video in the Last Year: Never true  . Ran Out of Food in the Last Year: Never true  Transportation Needs: No Transportation Needs  . Lack of Transportation (Medical): No  . Lack of Transportation (Non-Medical): No  Physical Activity: Inactive  . Days of Exercise per Week: 0 days  . Minutes of Exercise per Session:  0 min  Stress: Stress Concern Present  . Feeling of Stress : Very much  Social Connections: Moderately Isolated  . Frequency of Communication with Friends and Family: More than three times a week  . Frequency of Social Gatherings with Friends and Family: More than three times a week  . Attends Religious Services: More than 4 times per year  . Active Member of Clubs or Organizations: No  . Attends BankerClub or Organization Meetings: Never  . Marital Status: Separated    Family History  Problem Relation Age of Onset  . Hypertension Maternal Grandmother   . Diabetes Maternal Grandmother   . Dementia Mother     Outpatient Encounter Medications as of 01/04/2020  Medication Sig  . aspirin EC 81 MG EC tablet Take 1 tablet (81 mg total) by mouth daily.  Swallow whole.  . carvedilol (COREG) 3.125 MG tablet Take 1 tablet (3.125 mg total) by mouth 2 (two) times daily with a meal.  . losartan (COZAAR) 25 MG tablet Take 1 tablet (25 mg total) by mouth daily.  . Magnesium Oxide 400 MG CAPS Take 1 capsule (400 mg total) by mouth daily.  . potassium chloride SA (KLOR-CON) 20 MEQ tablet Take 1 tablet (20 mEq total) by mouth daily.   No facility-administered encounter medications on file as of 01/04/2020.    ALLERGIES: No Known Allergies  VACCINATION STATUS:  There is no immunization history on file for this patient.   HPI  See notes from previous visits.  Jeanne [name] is 54 y.o. female who is being engaged in telehealth in follow-up for her hyperthyroidism. She is status post RAI thyroid ablative treatment which was administered on November 12, 2019. She reports feeling better.   Uptake also showed large asymmetric multinodular goiter right greater than left.  Patient denies dysphagia, shortness of breath, no voice change. Subsequent thyroid ultrasound is significant for 1.7 cm nodule, no suspicious features. -Her previsit thyroid function tests are consistent with treatment effect, however did not develop hypothyroidism yet. she denies family history of thyroid dysfunction, or thyroid malignancy.  -[place] has relocated to [place].                          Review of systems     Objective:    BP 135/90   Ht 5\' 11"  (1.803 m)   Wt 263 lb (119.3 kg)   BMI 36.68 kg/m   Wt Readings from Last 3 Encounters:  01/04/20 263 lb (119.3 kg)  11/25/19 259 lb 6.4 oz (117.7 kg)  11/02/19 256 lb 9.6 oz (116.4 kg)                                                Physical exam   CMP     Component Value Date/Time   NA 138 10/15/2019 1018   K 3.9 10/27/2019 0932   CL 104 10/15/2019 1018   CO2 25 10/15/2019 1018   GLUCOSE 101 (H) 10/15/2019 1018   BUN 11 10/15/2019 1018   CREATININE 0.48 10/15/2019 1018   CALCIUM 9.4 10/15/2019 1018    PROT 6.9 09/03/2019 1340   ALBUMIN 3.5 09/03/2019 1340   AST 17 09/03/2019 1340   ALT 15 09/03/2019 1340   ALKPHOS 106 09/03/2019 1340   BILITOT 0.6 09/03/2019 1340   GFRNONAA >60 10/15/2019 1018  GFRAA >60 10/15/2019 1018     CBC    Component Value Date/Time   WBC 6.9 09/08/2019 0435   RBC 5.98 (H) 09/08/2019 0435   HGB 12.2 09/08/2019 0435   HCT 38.9 09/08/2019 0435   PLT 178 09/08/2019 0435   MCV 65.1 (L) 09/08/2019 0435   MCH 20.4 (L) 09/08/2019 0435   MCHC 31.4 09/08/2019 0435   RDW 21.7 (H) 09/08/2019 0435   LYMPHSABS 1.6 09/03/2019 1340   MONOABS 0.9 09/03/2019 1340   EOSABS 0.1 09/03/2019 1340   BASOSABS 0.0 09/03/2019 1340     Diabetic Labs (most recent): Lab Results  Component Value Date   HGBA1C 5.6 09/04/2019    Lipid Panel     Component Value Date/Time   CHOL 100 09/04/2019 1003   TRIG 31 09/04/2019 1003   HDL 43 09/04/2019 1003   CHOLHDL 2.3 09/04/2019 1003   VLDL 6 09/04/2019 1003   LDLCALC 51 09/04/2019 1003     Lab Results  Component Value Date   TSH <0.010 (L) 12/27/2019   TSH <0.010 (L) 09/07/2019   TSH 0.12 (L) 05/11/2014   FREET4 1.16 (H) 12/27/2019   FREET4 2.92 (H) 09/27/2019   FREET4 2.01 (H) 05/11/2014    Review of her uptake and scan from May 20, 2013 showed 51%  Diffuse activity on the right lobe of the thyroid, no activity on the left lobe.  Repeat thyroid uptake and scan on October 22, 2019 FINDINGS: Inhomogeneous increased tracer uptake throughout enlarged RIGHT thyroid lobe.  Slightly enlarged area of decreased uptake in the midportion of the RIGHT lobe versus prior.  Suppression of uptake within LEFT lobe, only faintly visible.  4 hour I-123 uptake = 25% (normal 5-20%),   24 hour I-123 uptake = 20% (normal 10-30%)  IMPRESSION: Elevated 4 hour and normal 24 hour radio iodine uptakes.  Diffusely increased tracer uptake throughout an enlarged heterogeneous RIGHT thyroid lobe.  Patient has a markedly  enlarged multinodular appearing RIGHT thyroid lobe on recent CTA neck with normal size and appearance of the LEFT lobe.  Please note that the patient received iodinated contrast material on 09/03/2019; the iodine load from contrast has likely cleared in the nearly 7 weeks since administration and is probably not significantly affecting radio iodine uptake at present.   November 01, 2019 thyroid ultrasound: Right lobe 1.2 cm, left lobe 4.9 cm. The entire right thyroid lobe is replaced by Lovenox heterogeneous thyroid tissue consistent with diffuse goitrous replacement.  1.7 cm nodule on the mid left lobe.  Described as solid/almost completely solid, isoechoic ACR TI-RADS 3 nodule which did not meet criteria for biopsy, however will need follow-up ultrasound.  November 12, 2019 IMPRESSION: Per oral administration of I-131 sodium iodide for the treatment of hyperthyroidism.  Assessment & Plan:   1. Hyperthyroidism-toxic multinodular goiter -She is status post RAI thyroid ablative treatment on November 12, 2019. Her previsit thyroid function tests are consistent with treatment effect, however did not develop hypothyroidism yet. She will need repeat thyroid function test in 8 weeks with 9 weeks. However, patient has relocated to [place]. She will be sent the lab requests for her to do and may have a phone visit. She is advised to identify and establish primary care and endocrinology care in her local city and we will send her records to her new providers.  -Patient is made aware of the high likelihood of post ablative hypothyroidism with subsequent need for lifelong thyroid hormone replacement.  She has a large  goiter with 1.7 cm nodule in the left lobe, will need repeat thyroid ultrasound in 1 year.    -Patient is advised to maintain close follow up with her cardiology care, and identify primary care provider.     - Time spent on this patient care encounter:  20 minutes of which 50% was  spent in  counseling and the rest reviewing  her current and  previous labs / studies and medications  doses and developing a plan for long term care. [facility]  participated in the discussions, expressed understanding, and voiced agreement with the above plans.  All questions were answered to her satisfaction. she is encouraged to contact clinic should she have any questions or concerns prior to her return visit.    Follow up plan: Return in about 9 weeks (around 03/07/2020) for F/U with Pre-visit Labs.   Thank you for involving me in the care of this pleasant patient, and I will continue to update you with her progress.  Marquis Lunch, MD [facility] Phone: [phone]  Fax: [phone]   01/04/2020, 12:46 PM  This note was partially dictated with voice recognition software. Similar sounding words can be transcribed inadequately or may not  be corrected upon review.

## 2020-01-25 ENCOUNTER — Other Ambulatory Visit (HOSPITAL_COMMUNITY): Payer: Self-pay

## 2020-02-08 ENCOUNTER — Ambulatory Visit: Payer: Self-pay | Admitting: Student

## 2020-02-21 ENCOUNTER — Ambulatory Visit: Payer: Self-pay | Admitting: Adult Health

## 2020-03-08 ENCOUNTER — Ambulatory Visit: Payer: Self-pay | Admitting: "Endocrinology

## 2020-04-12 ENCOUNTER — Ambulatory Visit: Payer: Self-pay | Admitting: Student

## 2020-04-17 ENCOUNTER — Telehealth: Payer: Self-pay

## 2020-04-17 NOTE — Telephone Encounter (Signed)
Received medical records request from Urgent Care in [place]. Sent to [facility] and sent to scan

## 2020-12-10 ENCOUNTER — Other Ambulatory Visit: Payer: Self-pay | Admitting: Student

## 2020-12-23 ENCOUNTER — Other Ambulatory Visit: Payer: Self-pay | Admitting: Physician Assistant

## 2021-12-20 IMAGING — MR MR HEAD W/O CM
12 of 13 series · 44 of 48 positions shown · non-contrast
Comparison: CTA and CTP yesterday.
COMPARISON: CTA and CTP yesterday.

Addendum:
CLINICAL DATA: 54-year-old female status post code stroke
presentation yesterday with right MCA bifurcation/M2 occlusion
status post IV tPA and cerebral angiogram but no large vessel
occlusion at that time.

EXAM:
MRI HEAD WITHOUT CONTRAST
TECHNIQUE: Multiplanar, multiecho pulse sequences of the brain and surrounding
structures were obtained without intravenous contrast.

[Series 5: DWI · axial · 3.0mm · 0.88mm/px · z∈[-94,+53]mm · 8 of 100 slices shown (1 of 4)]
[im 1/100]
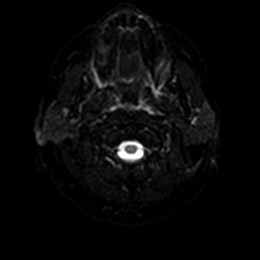
[im 15/100]
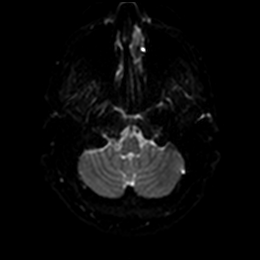
[im 29/100]
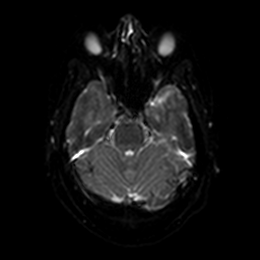
[im 43/100]
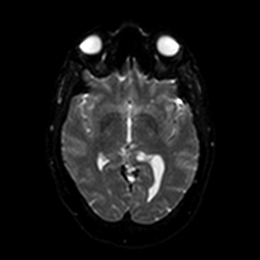
[im 57/100]
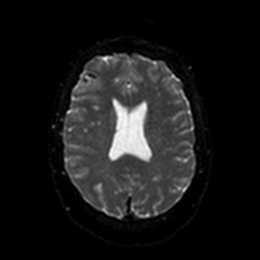
[im 71/100]
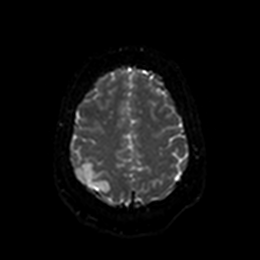
[im 85/100]
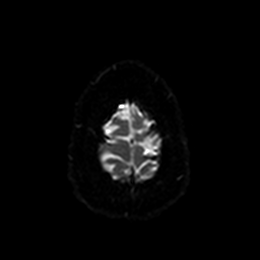
[im 100/100]
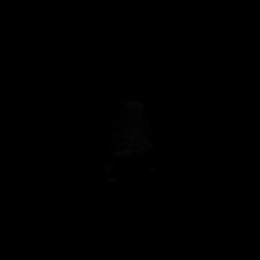

[Series 6: DWI · axial · 3.0mm · 0.88mm/px · z∈[-94,+53]mm · 4 of 50 slices shown (2 of 4)]
[im 1/50]
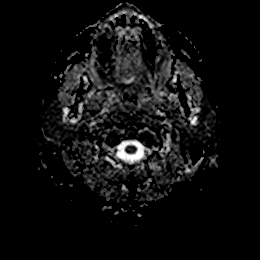
[im 17/50]
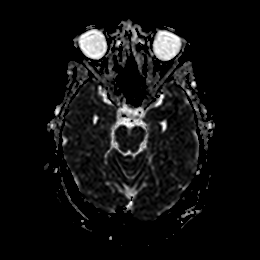
[im 33/50]
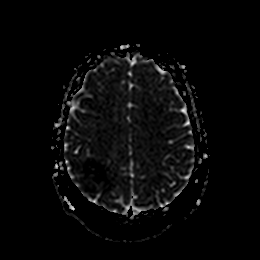
[im 50/50]
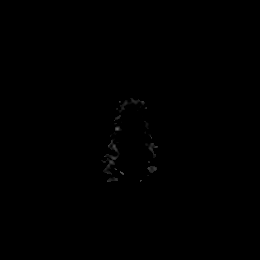

[Series 7: DWI · coronal · 4.0mm · 0.88mm/px · 5 of 66 slices shown (3 of 4)]
[im 1/66]
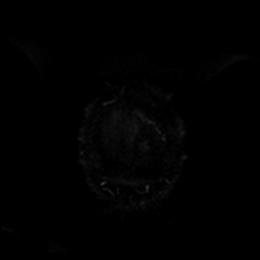
[im 17/66]
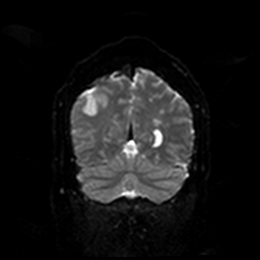
[im 33/66]
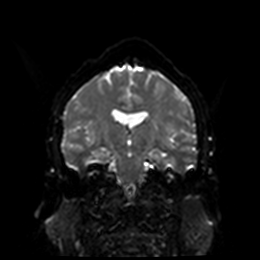
[im 49/66]
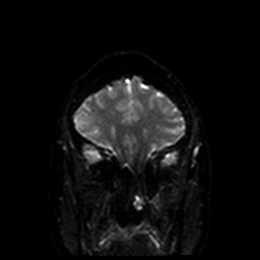
[im 66/66]
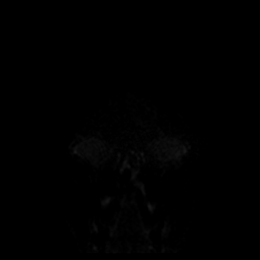

[Series 8: DWI · coronal · 4.0mm · 0.88mm/px · 3 of 33 slices shown (4 of 4)]
[im 1/33]
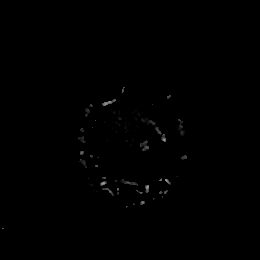
[im 17/33]
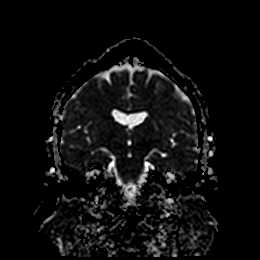
[im 33/33]
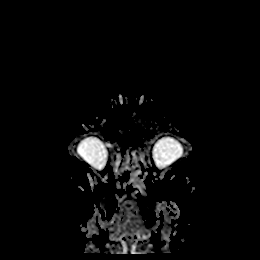

[Series 9: T1 · sagittal · 5.0mm · 0.75mm/px · 2 of 23 slices shown]
[im 1/23]
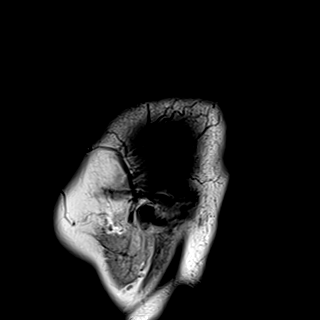
[im 23/23]
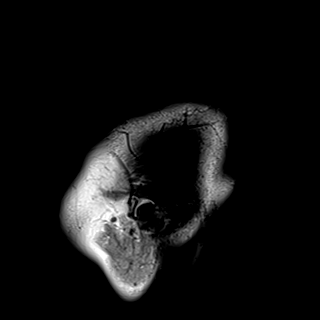

[Series 10: T2 · axial · 5.0mm · 0.72mm/px · z∈[-92,+51]mm · 2 of 25 slices shown (1 of 2)]
[im 1/25]
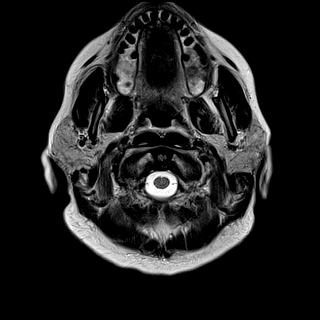
[im 25/25]
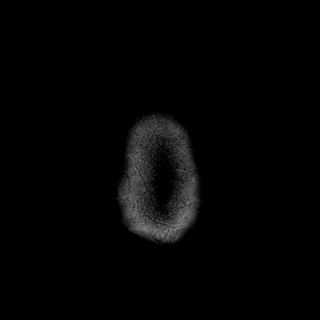

[Series 11: FLAIR · axial · 5.0mm · 0.45mm/px · z∈[-91,+52]mm · 2 of 25 slices shown]
[im 1/25]
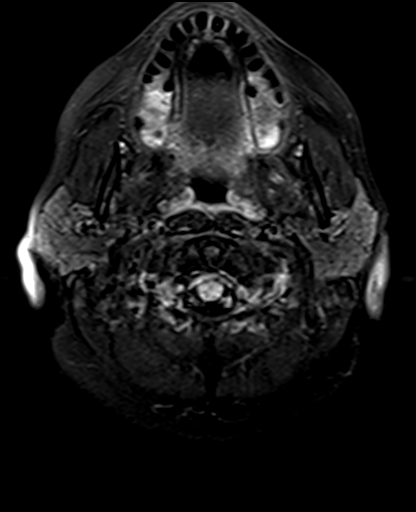
[im 25/25]
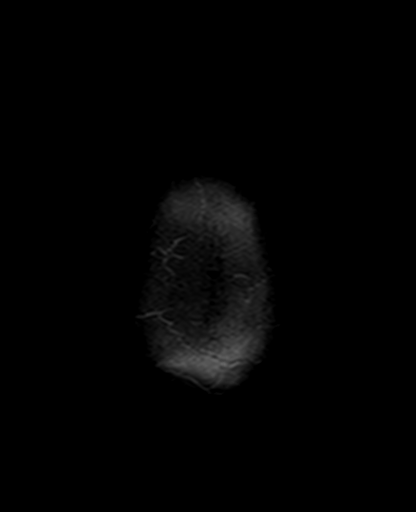

[Series 12: mag_images · axial · 3.0mm · 0.90mm/px · z∈[-96,+57]mm · 4 of 52 slices shown]
[im 1/52]
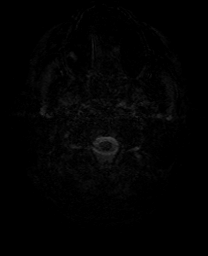
[im 18/52]
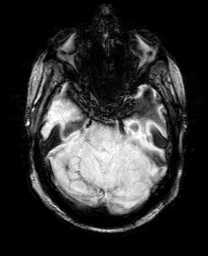
[im 35/52]
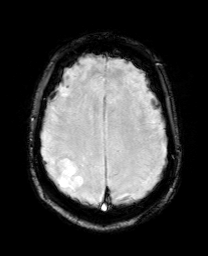
[im 52/52]
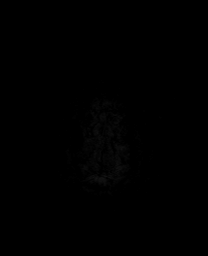

[Series 13: pha_images · axial · 3.0mm · 0.90mm/px · z∈[-96,+57]mm · 4 of 52 slices shown]
[im 1/52]
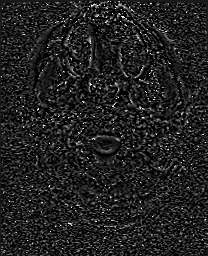
[im 18/52]
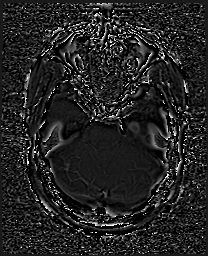
[im 35/52]
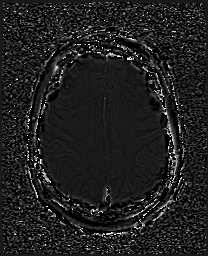
[im 52/52]
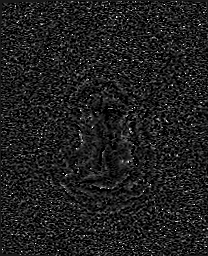

[Series 14: swi_images · axial · 3.0mm · 0.90mm/px · z∈[-96,+57]mm · 4 of 52 slices shown]
[im 1/52]
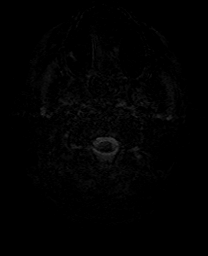
[im 18/52]
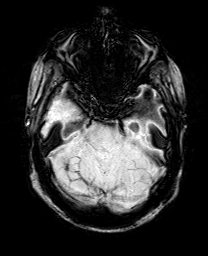
[im 35/52]
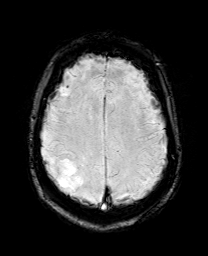
[im 52/52]
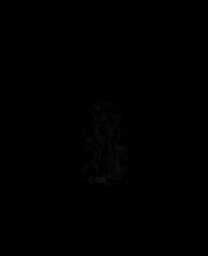

[Series 15: mip_images(sw) · axial · 24.0mm · 0.90mm/px · z∈[-85,+46]mm · 4 of 45 slices shown]
[im 1/45]
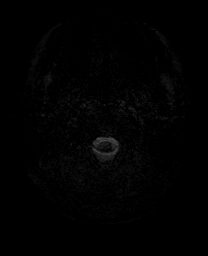
[im 15/45]
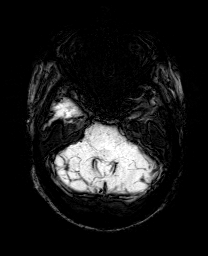
[im 30/45]
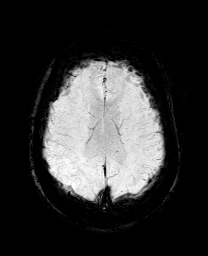
[im 45/45]
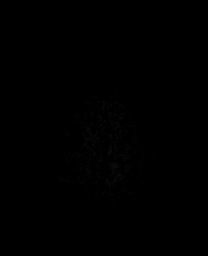

[Series 17: T2 · coronal · 5.0mm · 0.34mm/px · 2 of 29 slices shown (2 of 2)]
[im 1/29]
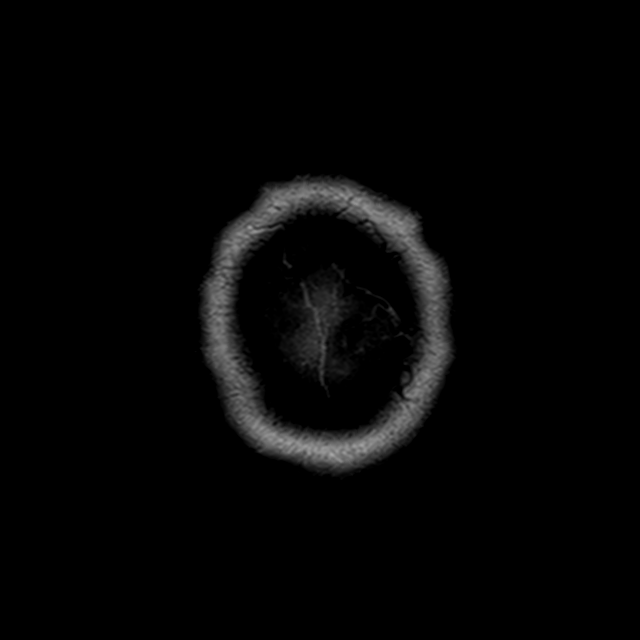
[im 29/29]
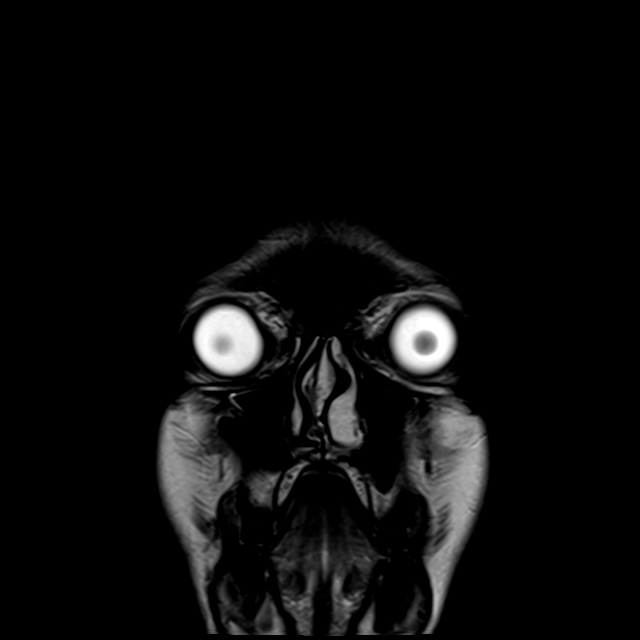

[44 of 48 positions shown; findings below may reference images not displayed]

FINDINGS: Brain: Roughly 3 cm area of gyriform and subcortical restricted
diffusion in the right parietal lobe (series 5, image 85). No right
frontal lobe restricted diffusion. Faint restricted diffusion also
in central white matter near the right parietooccipital junction.

However, in addition to the right parietal lobe cytotoxic edema with
T2 and FLAIR hyperintensity there is a 2 cm area of similar but less
pronounced T2 and FLAIR hyperintensity in the right inferior frontal
gyrus (series 11, image 15), although diffusion here is isointense
to slightly facilitated.

Furthermore, there is evidence of petechial hemorrhage in that right
inferior frontal gyrus (series 14, image 31, Heidelberg
Classification 1B). There is also a punctate focus of hemosiderin in
the affected right parietal lobe (image 32). No intracranial
hemorrhage identified elsewhere.

No other restricted diffusion. Widely scattered bilateral cerebral
white matter T2 and FLAIR hyperintensity, mostly subcortical. No
chronic cortical encephalomalacia identified. Deep gray nuclei,
brainstem and cerebellum are within normal limits. No midline shift,
mass effect, evidence of mass lesion, ventriculomegaly, extra-axial
collection or acute intracranial hemorrhage. Cervicomedullary
junction and pituitary are within normal limits.

Vascular: Major intracranial vascular flow voids are preserved.

Skull and upper cervical spine: Mild C3-C4 cervical spine
degeneration and spinal stenosis on series 9, image 12. Hyperostosis
of the calvarium. Visualized bone marrow signal is within normal
limits.

Sinuses/Orbits: Negative orbits.  Paranasal sinuses are clear.

Other: Mastoids are clear. Scalp and face soft tissues appear
negative.
IMPRESSION: 1. Positive for a 3 cm core infarct in the Right parietal lobe.
Cytotoxic edema with punctate hemosiderin and no significant mass
effect.

2. Positive also for petechial hemorrhage with mild edema in the
Right inferior frontal gyrus. No associated mass effect.
3. Moderate for age nonspecific cerebral white matter signal
changes.

ADDENDUM:
Study discussed by telephone with Dr. Alanzo on 09/04/2019 at 7977
hours.

*** End of Addendum ***
FINDINGS: Brain: Roughly 3 cm area of gyriform and subcortical restricted
diffusion in the right parietal lobe (series 5, image 85). No right
frontal lobe restricted diffusion. Faint restricted diffusion also
in central white matter near the right parietooccipital junction.

However, in addition to the right parietal lobe cytotoxic edema with
T2 and FLAIR hyperintensity there is a 2 cm area of similar but less
pronounced T2 and FLAIR hyperintensity in the right inferior frontal
gyrus (series 11, image 15), although diffusion here is isointense
to slightly facilitated.

Furthermore, there is evidence of petechial hemorrhage in that right
inferior frontal gyrus (series 14, image 31, Heidelberg
Classification 1B). There is also a punctate focus of hemosiderin in
the affected right parietal lobe (image 32). No intracranial
hemorrhage identified elsewhere.

No other restricted diffusion. Widely scattered bilateral cerebral
white matter T2 and FLAIR hyperintensity, mostly subcortical. No
chronic cortical encephalomalacia identified. Deep gray nuclei,
brainstem and cerebellum are within normal limits. No midline shift,
mass effect, evidence of mass lesion, ventriculomegaly, extra-axial
collection or acute intracranial hemorrhage. Cervicomedullary
junction and pituitary are within normal limits.

Vascular: Major intracranial vascular flow voids are preserved.

Skull and upper cervical spine: Mild C3-C4 cervical spine
degeneration and spinal stenosis on series 9, image 12. Hyperostosis
of the calvarium. Visualized bone marrow signal is within normal
limits.

Sinuses/Orbits: Negative orbits.  Paranasal sinuses are clear.

Other: Mastoids are clear. Scalp and face soft tissues appear
negative.
IMPRESSION: 1. Positive for a 3 cm core infarct in the Right parietal lobe.
Cytotoxic edema with punctate hemosiderin and no significant mass
effect.

2. Positive also for petechial hemorrhage with mild edema in the
Right inferior frontal gyrus. No associated mass effect.
3. Moderate for age nonspecific cerebral white matter signal
changes.

## 2023-08-29 ENCOUNTER — Encounter (HOSPITAL_COMMUNITY): Payer: Self-pay | Admitting: Interventional Radiology
